# Patient Record
Sex: Male | Born: 1935 | Race: White | Hispanic: No | Smoking: Never smoker
Health system: Southern US, Community
[De-identification: ages and names within clinical notes are randomized; demographics above are authoritative.]

## PROBLEM LIST (undated history)

## (undated) DIAGNOSIS — C44211 Basal cell carcinoma of skin of unspecified ear and external auricular canal: Secondary | ICD-10-CM

## (undated) DIAGNOSIS — N400 Enlarged prostate without lower urinary tract symptoms: Secondary | ICD-10-CM

## (undated) DIAGNOSIS — L570 Actinic keratosis: Secondary | ICD-10-CM

## (undated) DIAGNOSIS — L309 Dermatitis, unspecified: Secondary | ICD-10-CM

## (undated) DIAGNOSIS — J45909 Unspecified asthma, uncomplicated: Secondary | ICD-10-CM

## (undated) DIAGNOSIS — IMO0002 Reserved for concepts with insufficient information to code with codable children: Secondary | ICD-10-CM

## (undated) DIAGNOSIS — I4891 Unspecified atrial fibrillation: Secondary | ICD-10-CM

## (undated) DIAGNOSIS — T7840XA Allergy, unspecified, initial encounter: Secondary | ICD-10-CM

## (undated) HISTORY — DX: Reserved for concepts with insufficient information to code with codable children: IMO0002

## (undated) HISTORY — PX: FINGER SURGERY: SHX640

## (undated) HISTORY — DX: Benign prostatic hyperplasia without lower urinary tract symptoms: N40.0

## (undated) HISTORY — DX: Actinic keratosis: L57.0

## (undated) HISTORY — PX: MOHS SURGERY: SUR867

## (undated) HISTORY — DX: Allergy, unspecified, initial encounter: T78.40XA

## (undated) HISTORY — DX: Unspecified asthma, uncomplicated: J45.909

## (undated) HISTORY — DX: Unspecified atrial fibrillation: I48.91

## (undated) HISTORY — DX: Basal cell carcinoma of skin of unspecified ear and external auricular canal: C44.211

## (undated) HISTORY — DX: Dermatitis, unspecified: L30.9

---

## 1968-08-12 HISTORY — PX: VASECTOMY: SHX75

## 1978-08-12 HISTORY — PX: OTHER SURGICAL HISTORY: SHX169

## 2005-08-12 DIAGNOSIS — C44211 Basal cell carcinoma of skin of unspecified ear and external auricular canal: Secondary | ICD-10-CM

## 2005-08-12 HISTORY — DX: Basal cell carcinoma of skin of unspecified ear and external auricular canal: C44.211

## 2011-08-26 DIAGNOSIS — L570 Actinic keratosis: Secondary | ICD-10-CM | POA: Diagnosis not present

## 2011-08-26 DIAGNOSIS — Z85828 Personal history of other malignant neoplasm of skin: Secondary | ICD-10-CM | POA: Diagnosis not present

## 2011-12-12 DIAGNOSIS — J309 Allergic rhinitis, unspecified: Secondary | ICD-10-CM | POA: Diagnosis not present

## 2011-12-12 DIAGNOSIS — N401 Enlarged prostate with lower urinary tract symptoms: Secondary | ICD-10-CM | POA: Diagnosis not present

## 2012-02-10 DIAGNOSIS — C4491 Basal cell carcinoma of skin, unspecified: Secondary | ICD-10-CM | POA: Diagnosis not present

## 2012-02-10 DIAGNOSIS — L2089 Other atopic dermatitis: Secondary | ICD-10-CM | POA: Diagnosis not present

## 2012-04-23 DIAGNOSIS — L821 Other seborrheic keratosis: Secondary | ICD-10-CM | POA: Diagnosis not present

## 2012-04-23 DIAGNOSIS — L820 Inflamed seborrheic keratosis: Secondary | ICD-10-CM | POA: Diagnosis not present

## 2012-04-23 DIAGNOSIS — L570 Actinic keratosis: Secondary | ICD-10-CM | POA: Diagnosis not present

## 2012-04-23 DIAGNOSIS — D485 Neoplasm of uncertain behavior of skin: Secondary | ICD-10-CM | POA: Diagnosis not present

## 2012-04-23 DIAGNOSIS — L738 Other specified follicular disorders: Secondary | ICD-10-CM | POA: Diagnosis not present

## 2012-05-14 DIAGNOSIS — E785 Hyperlipidemia, unspecified: Secondary | ICD-10-CM | POA: Insufficient documentation

## 2012-05-14 DIAGNOSIS — Z23 Encounter for immunization: Secondary | ICD-10-CM | POA: Diagnosis not present

## 2012-05-14 DIAGNOSIS — R5381 Other malaise: Secondary | ICD-10-CM | POA: Diagnosis not present

## 2012-05-14 DIAGNOSIS — Z1210 Encounter for screening for malignant neoplasm of intestinal tract, unspecified: Secondary | ICD-10-CM | POA: Diagnosis not present

## 2012-05-14 DIAGNOSIS — N529 Male erectile dysfunction, unspecified: Secondary | ICD-10-CM | POA: Diagnosis not present

## 2012-05-14 DIAGNOSIS — N400 Enlarged prostate without lower urinary tract symptoms: Secondary | ICD-10-CM | POA: Diagnosis not present

## 2012-05-19 DIAGNOSIS — R5383 Other fatigue: Secondary | ICD-10-CM | POA: Diagnosis not present

## 2012-05-19 DIAGNOSIS — E785 Hyperlipidemia, unspecified: Secondary | ICD-10-CM | POA: Diagnosis not present

## 2012-05-19 DIAGNOSIS — N529 Male erectile dysfunction, unspecified: Secondary | ICD-10-CM | POA: Diagnosis not present

## 2012-07-14 DIAGNOSIS — M25519 Pain in unspecified shoulder: Secondary | ICD-10-CM | POA: Diagnosis not present

## 2012-10-23 DIAGNOSIS — L738 Other specified follicular disorders: Secondary | ICD-10-CM | POA: Diagnosis not present

## 2012-10-23 DIAGNOSIS — L570 Actinic keratosis: Secondary | ICD-10-CM | POA: Diagnosis not present

## 2012-10-23 DIAGNOSIS — L819 Disorder of pigmentation, unspecified: Secondary | ICD-10-CM | POA: Diagnosis not present

## 2012-10-23 DIAGNOSIS — L820 Inflamed seborrheic keratosis: Secondary | ICD-10-CM | POA: Diagnosis not present

## 2013-02-09 ENCOUNTER — Encounter: Payer: Self-pay | Admitting: Adult Health

## 2013-02-09 ENCOUNTER — Ambulatory Visit (INDEPENDENT_AMBULATORY_CARE_PROVIDER_SITE_OTHER): Payer: Commercial Managed Care - PPO | Admitting: Adult Health

## 2013-02-09 VITALS — BP 100/58 | HR 70 | Temp 97.6°F | Resp 12 | Ht 67.0 in | Wt 148.5 lb

## 2013-02-09 DIAGNOSIS — N400 Enlarged prostate without lower urinary tract symptoms: Secondary | ICD-10-CM | POA: Diagnosis not present

## 2013-02-09 DIAGNOSIS — R252 Cramp and spasm: Secondary | ICD-10-CM | POA: Diagnosis not present

## 2013-02-09 DIAGNOSIS — IMO0002 Reserved for concepts with insufficient information to code with codable children: Secondary | ICD-10-CM | POA: Diagnosis not present

## 2013-02-09 DIAGNOSIS — L570 Actinic keratosis: Secondary | ICD-10-CM | POA: Diagnosis not present

## 2013-02-09 LAB — BASIC METABOLIC PANEL
CO2: 28 mEq/L (ref 19–32)
Calcium: 9.6 mg/dL (ref 8.4–10.5)
Chloride: 104 mEq/L (ref 96–112)
Glucose, Bld: 71 mg/dL (ref 70–99)
Potassium: 5.1 mEq/L (ref 3.5–5.1)
Sodium: 135 mEq/L (ref 135–145)

## 2013-02-09 NOTE — Patient Instructions (Addendum)
   Thank you for choosing [place] at [facility] for your health care needs.  Please have your labs drawn prior to leaving the office.  The results will be available through MyChart for your convenience. Please remember to activate this. The activation code is located at the end of this form.  Please schedule your Medicare Wellness Exam at the anniversary date.  Please feel free to contact our office with any questions or concerns.

## 2013-02-09 NOTE — Progress Notes (Signed)
Subjective:    Patient ID: Kelly Hoover, male    DOB: 1931-03-17, 79 y.o.   MRN: [id number]  HPI  Patient is a pleasant 79 year old male who presents to clinic to establish care. 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 Patient has no concerns during visit today. Overall he is feeling very well.  Past Medical History  Diagnosis Date  . Allergy   . Asthma     as a child  . Eczema   . Basal cell carcinoma of ear 2007    Mohs surgery - followed at [facility] q 6 months  . Actinic keratosis     Followed at [facility] q 6 month  . BPH (benign prostatic hypertrophy)   . Degenerative disk disease     C3-C4, C5-C6    Past Surgical History  Procedure Laterality Date  . Rectal fistula  1980    repair  . Vasectomy  1970  . Finger surgery      left hand middle and index finger  . Mohs surgery      top of right ear    Family History  Problem Relation Age of Onset  . Stroke Father     Died age 53  . Cancer Sister 61    lymphoma - remission  . Dementia Mother     died age 68 (dementia and osteopororis)  . Hypothyroidism Daughter   . Hypothyroidism Son   . Hypothyroidism Daughter   . Hypothyroidism Daughter     History   Social History  . Marital Status: Widowed    Spouse Name: N/A    Number of Children: 4  . Years of Education: 18   Occupational History  . Not on file.   Social History Main Topics  . Smoking status: Never Smoker   . Smokeless tobacco: Never Used  . Alcohol Use: 7.0 oz/week    14 drink(s) per week     Comment: 1-2 glasses of wine daily  . Drug Use: No  . Sexually Active: Not on file   Other Topics Concern  . Not on file   Social History Narrative  . No narrative on  file     Health Maintenance:  Tdap - 12/17/07 (Due 2019)  Flu shot - Yearly October  Pneumococcal vaccine - 1997  Zostavax - 07/18/07  Colonoscopy - Sigmoidoscopy 1997 - No colonoscopy  Labs - Yearly physical in October  Depression Screen - No hopelessness, sadness or feelings of anhedonia. He maintains an active schedule and socializes regularly.  Tobacco Use - No smoking  Dental Exams - Yearly exam and cleaning  Vision Exam - Yearly  Exercise - Regularly - Core exercises  Diet - High fiber, low saturated fats.     Review of Systems  Constitutional: Negative.   HENT: Positive for hearing loss, neck stiffness, sinus pressure and tinnitus. Negative for sore throat, trouble swallowing and neck pain.        Tinnitus bilateral ears. Not severe. Patient able to cope.  Eyes: Negative.   Respiratory: Negative.   Cardiovascular: Negative.   Gastrointestinal: Negative for nausea, vomiting, abdominal pain, diarrhea, constipation, blood in stool and rectal pain.       Takes metamucil for regularity  Endocrine: Negative.   Genitourinary: Positive for urgency and frequency. Negative for dysuria and hematuria.       Enlarged prostate. Urine slow to start. Gets up 1-2 times per night.  Musculoskeletal: Negative for back pain.  Skin:       Followed by Vaughan Sine every 6 months.  Allergic/Immunologic: Negative for environmental allergies and food allergies.       Mild seasonal allergies  Neurological: Negative for speech difficulty and numbness.       Occasional lightheaded - does not occur often  Hematological: Negative.   Psychiatric/Behavioral: Negative for behavioral problems, confusion and agitation. The patient is not nervous/anxious.     BP 100/58  Pulse 70  Temp(Src) 97.6 F (36.4 C) (Oral)  Resp 12  Ht 5\' 7"  (1.702 m)  Wt 148 lb 8 oz (67.359 kg)  BMI 23.25 kg/m2  SpO2 96%    Objective:   Physical Exam  Constitutional: He is oriented to person, place, and time. He  appears well-developed and well-nourished. No distress.  HENT:  Head: Normocephalic and atraumatic.  Right Ear: External ear normal.  Left Ear: External ear normal.  Nose: Nose normal.  Mouth/Throat: Oropharynx is clear and moist. No oropharyngeal exudate.  Eyes: Conjunctivae and EOM are normal. Pupils are equal, round, and reactive to light. Right eye exhibits no discharge. Left eye exhibits no discharge.  Neck: No tracheal deviation present.  Decreased ROM of neck  Cardiovascular: Normal rate, regular rhythm, normal heart sounds and intact distal pulses.  Exam reveals no gallop and no friction rub.   No murmur heard. Pulmonary/Chest: Effort normal and breath sounds normal. No respiratory distress. He has no wheezes. He has no rales.  Abdominal: Soft. Bowel sounds are normal. He exhibits no distension and no mass. There is no tenderness. There is no rebound and no guarding.  Musculoskeletal: Normal range of motion. He exhibits no edema and no tenderness.  Lymphadenopathy:    He has no cervical adenopathy.  Neurological: He is alert and oriented to person, place, and time. He has normal reflexes. No cranial nerve deficit. Coordination normal.  Skin: Skin is warm and dry.  Psychiatric: He has a normal mood and affect. His behavior is normal. Judgment and thought content normal.       Assessment & Plan:

## 2013-02-10 ENCOUNTER — Encounter: Payer: Self-pay | Admitting: *Deleted

## 2013-02-13 ENCOUNTER — Encounter: Payer: Self-pay | Admitting: Adult Health

## 2013-02-13 DIAGNOSIS — N400 Enlarged prostate without lower urinary tract symptoms: Secondary | ICD-10-CM | POA: Insufficient documentation

## 2013-02-13 DIAGNOSIS — IMO0002 Reserved for concepts with insufficient information to code with codable children: Secondary | ICD-10-CM | POA: Insufficient documentation

## 2013-02-13 DIAGNOSIS — L570 Actinic keratosis: Secondary | ICD-10-CM | POA: Insufficient documentation

## 2013-02-13 NOTE — Assessment & Plan Note (Signed)
Patient with decreased range of motion of the neck. Reports no pain. The patient has been stable and maintains activity level and reports good quality of life. Continue exercise.

## 2013-02-13 NOTE — Assessment & Plan Note (Signed)
Patient takes Flomax 0.4 mg every other day and has been doing this for several years. Reports getting up 1-2 times during the night but able to resume sleep easily. Continue same plan of care.

## 2013-02-13 NOTE — Assessment & Plan Note (Signed)
Followed by Vaughan Sine Q 6 months.

## 2013-02-24 ENCOUNTER — Other Ambulatory Visit: Payer: Self-pay | Admitting: Adult Health

## 2013-02-24 MED ORDER — MOMETASONE FUROATE 50 MCG/ACT NA SUSP: 2.0000 | Freq: Every day | NASAL | Status: DC

## 2013-02-24 NOTE — Telephone Encounter (Signed)
Needs Nasonex nasal spray or recommend an alternative.  States Kelly Hoover suggested a change in medication at his last visit which is fine with him.  [facility] Rx # [id number].  Phone# [phone] option #2.

## 2013-02-24 NOTE — Telephone Encounter (Signed)
Do you know what change he is referring to? Otherwise I'll send refill for Nasonex.

## 2013-02-24 NOTE — Telephone Encounter (Signed)
I cannot remember what I told him. Flonase? Anyway, send in prescription for Nasonex as long as still helping.

## 2013-03-01 ENCOUNTER — Telehealth: Payer: Self-pay | Admitting: Adult Health

## 2013-03-01 MED ORDER — MOMETASONE FUROATE 50 MCG/ACT NA SUSP: 2.0000 | Freq: Every day | NASAL | Status: DC

## 2013-03-01 NOTE — Telephone Encounter (Signed)
Yes, flonase

## 2013-03-01 NOTE — Telephone Encounter (Signed)
Pt is calling about the Nasonex was sent in wrong and it is charging him 55 dollars and month and wanted to know about hte alternative that Raquel stated at his appointment to see if that one would be cheaper ???

## 2013-03-01 NOTE — Telephone Encounter (Signed)
Pt states he received a one month supply of Nasonex, rather than a 3 month, which was more costly. He prefers to stay on Nasonex at this time, but requests a 3 month supply be sent in for future refills.

## 2013-03-01 NOTE — Telephone Encounter (Signed)
Flonase? That's what you mentioned in last message.

## 2013-03-17 ENCOUNTER — Other Ambulatory Visit: Payer: Self-pay

## 2013-03-30 DIAGNOSIS — H2510 Age-related nuclear cataract, unspecified eye: Secondary | ICD-10-CM | POA: Diagnosis not present

## 2013-04-13 ENCOUNTER — Telehealth: Payer: Self-pay | Admitting: Adult Health

## 2013-04-13 MED ORDER — TAMSULOSIN HCL 0.4 MG PO CAPS: 0.4000 mg | ORAL_CAPSULE | Freq: Every day | ORAL | Status: DC

## 2013-04-13 NOTE — Telephone Encounter (Signed)
Pt is needing refill on Tamsulosin HCL Caps 0.4MG  1 capsule daily. Pt uses [facility].

## 2013-04-28 DIAGNOSIS — D485 Neoplasm of uncertain behavior of skin: Secondary | ICD-10-CM | POA: Diagnosis not present

## 2013-04-28 DIAGNOSIS — L819 Disorder of pigmentation, unspecified: Secondary | ICD-10-CM | POA: Diagnosis not present

## 2013-04-28 DIAGNOSIS — C44319 Basal cell carcinoma of skin of other parts of face: Secondary | ICD-10-CM | POA: Diagnosis not present

## 2013-04-28 DIAGNOSIS — D692 Other nonthrombocytopenic purpura: Secondary | ICD-10-CM | POA: Diagnosis not present

## 2013-04-28 DIAGNOSIS — L570 Actinic keratosis: Secondary | ICD-10-CM | POA: Diagnosis not present

## 2013-06-09 DIAGNOSIS — D485 Neoplasm of uncertain behavior of skin: Secondary | ICD-10-CM | POA: Diagnosis not present

## 2013-06-09 DIAGNOSIS — Z85828 Personal history of other malignant neoplasm of skin: Secondary | ICD-10-CM | POA: Diagnosis not present

## 2013-06-09 DIAGNOSIS — L570 Actinic keratosis: Secondary | ICD-10-CM | POA: Diagnosis not present

## 2013-06-09 DIAGNOSIS — C44211 Basal cell carcinoma of skin of unspecified ear and external auricular canal: Secondary | ICD-10-CM | POA: Diagnosis not present

## 2013-06-10 DIAGNOSIS — Z23 Encounter for immunization: Secondary | ICD-10-CM | POA: Diagnosis not present

## 2013-06-17 ENCOUNTER — Other Ambulatory Visit: Payer: Self-pay

## 2013-06-24 DIAGNOSIS — L821 Other seborrheic keratosis: Secondary | ICD-10-CM | POA: Diagnosis not present

## 2013-06-24 DIAGNOSIS — L570 Actinic keratosis: Secondary | ICD-10-CM | POA: Diagnosis not present

## 2013-06-24 DIAGNOSIS — L820 Inflamed seborrheic keratosis: Secondary | ICD-10-CM | POA: Diagnosis not present

## 2013-06-24 DIAGNOSIS — Z85828 Personal history of other malignant neoplasm of skin: Secondary | ICD-10-CM | POA: Diagnosis not present

## 2013-06-24 DIAGNOSIS — C44319 Basal cell carcinoma of skin of other parts of face: Secondary | ICD-10-CM | POA: Diagnosis not present

## 2013-06-24 DIAGNOSIS — D485 Neoplasm of uncertain behavior of skin: Secondary | ICD-10-CM | POA: Diagnosis not present

## 2013-08-23 DIAGNOSIS — Z85828 Personal history of other malignant neoplasm of skin: Secondary | ICD-10-CM | POA: Diagnosis not present

## 2013-08-23 DIAGNOSIS — C44319 Basal cell carcinoma of skin of other parts of face: Secondary | ICD-10-CM | POA: Diagnosis not present

## 2013-10-21 DIAGNOSIS — Z85828 Personal history of other malignant neoplasm of skin: Secondary | ICD-10-CM | POA: Diagnosis not present

## 2013-10-21 DIAGNOSIS — L259 Unspecified contact dermatitis, unspecified cause: Secondary | ICD-10-CM | POA: Diagnosis not present

## 2013-10-21 DIAGNOSIS — L570 Actinic keratosis: Secondary | ICD-10-CM | POA: Diagnosis not present

## 2013-10-21 DIAGNOSIS — L820 Inflamed seborrheic keratosis: Secondary | ICD-10-CM | POA: Diagnosis not present

## 2013-10-21 DIAGNOSIS — R21 Rash and other nonspecific skin eruption: Secondary | ICD-10-CM | POA: Diagnosis not present

## 2013-11-16 ENCOUNTER — Encounter: Payer: Self-pay | Admitting: Adult Health

## 2013-11-16 ENCOUNTER — Ambulatory Visit (INDEPENDENT_AMBULATORY_CARE_PROVIDER_SITE_OTHER): Payer: Commercial Managed Care - PPO | Admitting: Adult Health

## 2013-11-16 VITALS — BP 108/60 | HR 83 | Temp 97.7°F | Resp 14 | Ht 67.5 in | Wt 153.0 lb

## 2013-11-16 DIAGNOSIS — R5381 Other malaise: Secondary | ICD-10-CM | POA: Diagnosis not present

## 2013-11-16 DIAGNOSIS — R42 Dizziness and giddiness: Secondary | ICD-10-CM | POA: Diagnosis not present

## 2013-11-16 DIAGNOSIS — G4730 Sleep apnea, unspecified: Secondary | ICD-10-CM | POA: Diagnosis not present

## 2013-11-16 DIAGNOSIS — R5383 Other fatigue: Secondary | ICD-10-CM | POA: Diagnosis not present

## 2013-11-16 DIAGNOSIS — E785 Hyperlipidemia, unspecified: Secondary | ICD-10-CM | POA: Diagnosis not present

## 2013-11-16 DIAGNOSIS — Z0000 Encounter for general adult medical examination without abnormal findings: Secondary | ICD-10-CM | POA: Diagnosis not present

## 2013-11-16 LAB — BASIC METABOLIC PANEL
BUN: 20 mg/dL (ref 6–23)
CHLORIDE: 103 meq/L (ref 96–112)
CO2: 27 mEq/L (ref 19–32)
CREATININE: 1.2 mg/dL (ref 0.4–1.5)
Calcium: 9.5 mg/dL (ref 8.4–10.5)
GFR: 64.95 mL/min (ref >60.00)
Glucose, Bld: 88 mg/dL (ref 70–99)
Potassium: 5 mEq/L (ref 3.5–5.1)
Sodium: 137 mEq/L (ref 135–145)

## 2013-11-16 LAB — CBC WITH DIFFERENTIAL/PLATELET
BASOS ABS: 0 10*3/uL (ref 0.0–0.1)
Basophils Relative: 0.2 % (ref 0.0–3.0)
EOS ABS: 0.3 10*3/uL (ref 0.0–0.7)
Eosinophils Relative: 2.8 % (ref 0.0–5.0)
HCT: 45 % (ref 39.0–52.0)
HEMOGLOBIN: 15.1 g/dL (ref 13.0–17.0)
LYMPHS PCT: 10.6 % — AB (ref 12.0–46.0)
Lymphs Abs: 1.3 10*3/uL (ref 0.7–4.0)
MCHC: 33.4 g/dL (ref 30.0–36.0)
MCV: 99.1 fl (ref 78.0–100.0)
Monocytes Absolute: 1.1 10*3/uL — ABNORMAL HIGH (ref 0.1–1.0)
Monocytes Relative: 9.3 % (ref 3.0–12.0)
NEUTROS ABS: 9.5 10*3/uL — AB (ref 1.4–7.7)
NEUTROS PCT: 77.1 % — AB (ref 43.0–77.0)
Platelets: 212 10*3/uL (ref 150.0–400.0)
RBC: 4.54 Mil/uL (ref 4.22–5.81)
RDW: 13.4 % (ref 11.5–14.6)
WBC: 12.3 10*3/uL — ABNORMAL HIGH (ref 4.5–10.5)

## 2013-11-16 LAB — LIPID PANEL
CHOLESTEROL: 206 mg/dL — AB (ref 0–200)
HDL: 71.3 mg/dL (ref >39.00)
LDL CALC: 124 mg/dL — AB (ref 0–99)
TRIGLYCERIDES: 54 mg/dL (ref 0.0–149.0)
Total CHOL/HDL Ratio: 3
VLDL: 10.8 mg/dL (ref 0.0–40.0)

## 2013-11-16 MED ORDER — PREDNISONE 10 MG PO TABS: ORAL_TABLET | ORAL | Status: DC

## 2013-11-16 NOTE — Progress Notes (Signed)
Pre visit review using our clinic review tool, if applicable. No additional management support is needed unless otherwise documented below in the visit note. 

## 2013-11-16 NOTE — Progress Notes (Signed)
Patient ID: Kelly Hoover, male   DOB: Sep 14, 1930, 80 y.o.   MRN: [id number]   Subjective:    Patient ID: Kelly Hoover, male    DOB: 05/24/1931, 80 y.o.   MRN: [id number]  HPI  The patient is here for annual Medicare wellness examination and management of other chronic and acute problems.   The risk factors are reflected in the social history.  The roster of all physicians providing medical care to patient is listed in the Snapshot section of the chart.  Activities of daily living:  The patient is 100% independent in all ADLs: dressing, toileting, bathing, feeding as well as independent mobility.  Instrumental Activities of daily living: The patient is 100% independent in all iADLs: cooking, driving, keeping track of finances, managing medications, shopping, using telephone and computer.  Home safety: The patient has smoke detectors in the home. Seatbelts are worn 100%.  There are no firearms at home. There is no violence in the home. No hx of IPV.  There is no risks for hepatitis, STDs or HIV. There is no history of blood transfusion. No travel history to infectious disease endemic areas of the world.  The patient has seen dentist in the last six month. Pt has seen eye doctor in the last year. No hearing impairment. They have deferred audiologic testing in the last year.  No excessive sun exposure. Discussed the need for sun protection: hats, long sleeves and use of sunscreen if there is significant sun exposure. He is followed by Dermatology every 6 months for hx of actinic keratosis.  Diet: the importance of a healthy diet is discussed. Pt follows a healthy diet. Not tolerating red meats so he is avoiding. Able to eat poultry, pork, fish.  The benefits of regular aerobic exercise were discussed.   Depression screen: there are no signs or vegative symptoms of depression- irritability, change in appetite, anhedonia, sadness/tearfullness.  Cognitive assessment: the patient manages all  their financial and personal affairs and is actively engaged. Able to relate day,date,year and events; recalled 2/3 objects at 3 minutes; performed clock-face test normally.  The following portions of the patient's history were reviewed and updated as appropriate: allergies, current medications, past family history, past medical history,  past surgical history, past social history  and problem list.  Visual acuity was not assessed per patient preference since has regular follow up with ophthalmologist. Hearing and body mass index were assessed and reviewed.   During the course of the visit the patient was educated and counseled about appropriate screening and preventive services including : fall prevention , diabetes screening, nutrition counseling, colorectal cancer screening, and recommended immunizations.    Pt reports that family has observed him have periods of apnea. He also has been told that he snores. Feeling tired during the day.   He reports an episode of getting lightheaded while playing golf. He felt like he was going to pass out but the symptom resolved quickly. He states that he has noticed his blood pressure lower on the morning after he takes his flomax. He only takes the flomax every other day. During that time, he also had traveled by car to [place]. Thought he might have been tired and slightly dehydrated from the trip. The incident occurred a day after returning from trip.    Past Medical History  Diagnosis Date  . Allergy   . Asthma     as a child  . Eczema   . Basal cell carcinoma of ear 2007  Mohs surgery - followed at [facility] q 6 months  . Actinic keratosis     Followed at [facility] q 6 month  . BPH (benign prostatic hypertrophy)   . Degenerative disk disease     C3-C4, C5-C6     Past Surgical History  Procedure Laterality Date  . Rectal fistula  1980    repair  . Vasectomy  1970  . Finger surgery      left hand middle and index finger  . Mohs surgery       top of right ear     Family History  Problem Relation Age of Onset  . Stroke Father     Died age 19  . Cancer Sister 47    lymphoma - remission  . Dementia Mother     died age 86 (dementia and osteopororis)  . Hypothyroidism Daughter   . Hypothyroidism Son   . Hypothyroidism Daughter   . Hypothyroidism Daughter      History   Social History  . Marital Status: Widowed    Spouse Name: N/A    Number of Children: 4  . Years of Education: 18   Occupational History  . Not on file.   Social History Main Topics  . Smoking status: Never Smoker   . Smokeless tobacco: Never Used  . Alcohol Use: 7.0 oz/week    14 drink(s) per week     Comment: 1-2 glasses of wine daily  . Drug Use: No  . Sexual Activity: Not on file   Other Topics Concern  . Not on file   Social History Narrative  . No narrative on file         Past Medical History  Diagnosis Date  . Allergy   . Asthma     as a child  . Eczema   . Basal cell carcinoma of ear 2007    Mohs surgery - followed at [facility] q 6 months  . Actinic keratosis     Followed at [facility] q 6 month  . BPH (benign prostatic hypertrophy)   . Degenerative disk disease     C3-C4, C5-C6     Past Surgical History  Procedure Laterality Date  . Rectal fistula  1980    repair  . Vasectomy  1970  . Finger surgery      left hand middle and index finger  . Mohs surgery      top of right ear     Family History  Problem Relation Age of Onset  . Stroke Father     Died age 68  . Cancer Sister 35    lymphoma - remission  . Dementia Mother     died age 60 (dementia and osteopororis)  . Hypothyroidism Daughter   . Hypothyroidism Son   . Hypothyroidism Daughter   . Hypothyroidism Daughter      History   Social History  . Marital Status: Widowed    Spouse Name: N/A    Number of Children: 4  . Years of Education: 18   Occupational History  . Not on file.   Social History Main Topics  . Smoking status:  Never Smoker   . Smokeless tobacco: Never Used  . Alcohol Use: 7.0 oz/week    14 drink(s) per week     Comment: 1-2 glasses of wine daily  . Drug Use: No  . Sexual Activity: Not on file   Other Topics Concern  . Not on file  Social History Narrative  . No narrative on file     Current Outpatient Prescriptions on File Prior to Visit  Medication Sig Dispense Refill  . aspirin 81 MG tablet Take 81 mg by mouth daily.      . Carboxymethylcellulose Sodium (THERATEARS OP) Apply to eye as needed.      . cholecalciferol (VITAMIN D) 1000 UNITS tablet Take 1,000 Units by mouth daily.      . mometasone (NASONEX) 50 MCG/ACT nasal spray Place 2 sprays into the nose daily.  51 g  3  . psyllium (METAMUCIL) 58.6 % packet Take 1 packet by mouth daily.      . sodium chloride (OCEAN) 0.65 % nasal spray Place 1 spray into the nose as needed for congestion.      . tamsulosin (FLOMAX) 0.4 MG CAPS capsule Take 1 capsule (0.4 mg total) by mouth daily.  90 capsule  1  . Triamcinolone Acetonide (TRIAMCINOLONE 0.1 % CREAM : EUCERIN) CREA Apply 1 application topically 2 (two) times daily as needed.       No current facility-administered medications on file prior to visit.     Review of Systems  Constitutional: Negative.   HENT: Negative.   Eyes: Negative.   Respiratory: Negative.   Cardiovascular: Negative.   Gastrointestinal: Negative.   Endocrine: Negative.   Genitourinary: Negative.   Musculoskeletal: Negative.   Skin: Negative.   Allergic/Immunologic: Negative.   Neurological: Negative.   Hematological: Negative.   Psychiatric/Behavioral: Negative.        Objective:  BP 108/60  Pulse 83  Temp(Src) 97.7 F (36.5 C) (Oral)  Resp 14  Ht 5' 7.5" (1.715 m)  Wt 153 lb (69.4 kg)  BMI 23.60 kg/m2  SpO2 97%   Physical Exam  Constitutional: He is oriented to person, place, and time. He appears well-developed and well-nourished. No distress.  HENT:  Head: Normocephalic and atraumatic.    Right Ear: External ear normal.  Left Ear: External ear normal.  Nose: Nose normal.  Mouth/Throat: Oropharynx is clear and moist.  Eyes: Conjunctivae and EOM are normal. Pupils are equal, round, and reactive to light.  Neck: Normal range of motion. Neck supple. No tracheal deviation present. No thyromegaly present.  Cardiovascular: Normal rate, regular rhythm, normal heart sounds and intact distal pulses.  Exam reveals no gallop and no friction rub.   No murmur heard. Pulmonary/Chest: Effort normal and breath sounds normal. No respiratory distress. He has no wheezes. He has no rales.  Abdominal: Soft. Bowel sounds are normal. He exhibits no distension and no mass. There is no tenderness. There is no rebound and no guarding.  Musculoskeletal: Normal range of motion. He exhibits no edema and no tenderness.  Lymphadenopathy:    He has no cervical adenopathy.  Neurological: He is alert and oriented to person, place, and time. He has normal reflexes. No cranial nerve deficit. Coordination normal.  Skin: Skin is warm and dry.  Psychiatric: He has a normal mood and affect. His behavior is normal. Judgment and thought content normal.       Assessment & Plan:   1. Routine general medical examination at a health care facility Normal physical exam. Annual comprehensive exam was done including prostate exam, . All screenings have been addressed.   2. HLD (hyperlipidemia) Screen for HLD. Check labs - Lipid panel  3. Sleep apnea Ordered sleep study. - Nocturnal polysomnography (NPSG); Future  4. Fatigue May be secondary to his reports of observed sleep apnea. Will check labs. -  CBC with Differential  5. Lightheaded Blood pressure is on the low end of normal. He is feeling fine today. Discussed that flomax can lower his blood pressure. He needs to drink fluids to stay hydrated. Change positions slowly. Will check labs.  - Basic metabolic panel

## 2013-11-16 NOTE — Patient Instructions (Signed)
  You had your Medicare Wellness Exam today.  Please have your labs drawn prior to leaving. I will contact you with the results once they are available.  The episode of lightheadedness could have been brought on by dehydration, changes in your electrolytes or by a drop in your blood pressure. I am checking you electrolytes and kidney function.  I have ordered a sleep study to evaluate for sleep apnea. The office will contact you with the information.  I have sent in a prescription for prednisone to [facility] for contact dermatitis.  I sent in a prescription for Nasonex to your mail order pharmacy.  Return for a 6 month follow up or sooner if necessary.

## 2013-11-21 ENCOUNTER — Encounter: Payer: Self-pay | Admitting: Adult Health

## 2013-11-29 ENCOUNTER — Encounter: Payer: Self-pay | Admitting: Adult Health

## 2013-11-29 ENCOUNTER — Other Ambulatory Visit: Payer: Self-pay | Admitting: Adult Health

## 2013-11-29 MED ORDER — FLUTICASONE PROPIONATE 50 MCG/ACT NA SUSP: 2.0000 | Freq: Every day | NASAL | Status: DC

## 2013-11-29 NOTE — Telephone Encounter (Signed)
Advice needed on generic medication for flonase. Please advise.

## 2013-12-16 ENCOUNTER — Encounter: Payer: Self-pay | Admitting: Adult Health

## 2013-12-27 ENCOUNTER — Encounter: Payer: Self-pay | Admitting: Adult Health

## 2013-12-27 ENCOUNTER — Ambulatory Visit (INDEPENDENT_AMBULATORY_CARE_PROVIDER_SITE_OTHER): Payer: Medicare Other | Admitting: Adult Health

## 2013-12-27 VITALS — BP 118/64 | HR 63 | Temp 97.8°F | Resp 14 | Wt 149.8 lb

## 2013-12-27 DIAGNOSIS — M549 Dorsalgia, unspecified: Secondary | ICD-10-CM | POA: Diagnosis not present

## 2013-12-27 DIAGNOSIS — R209 Unspecified disturbances of skin sensation: Secondary | ICD-10-CM | POA: Diagnosis not present

## 2013-12-27 DIAGNOSIS — Z23 Encounter for immunization: Secondary | ICD-10-CM

## 2013-12-27 DIAGNOSIS — R202 Paresthesia of skin: Secondary | ICD-10-CM

## 2013-12-27 NOTE — Addendum Note (Signed)
Addended by: Geni Bers on: 12/27/2013 04:49 PM   Modules accepted: Orders

## 2013-12-27 NOTE — Patient Instructions (Signed)
Back Pain, Adult Low back pain is very common. About 1 in 5 people have back pain.The cause of low back pain is rarely dangerous. The pain often gets better over time.About half of people with a sudden onset of back pain feel better in just 2 weeks. About 8 in 10 people feel better by 6 weeks.  CAUSES Some common causes of back pain include:  Strain of the muscles or ligaments supporting the spine.  Wear and tear (degeneration) of the spinal discs.  Arthritis.  Direct injury to the back. DIAGNOSIS Most of the time, the direct cause of low back pain is not known.However, back pain can be treated effectively even when the exact cause of the pain is unknown.Answering your caregiver's questions about your overall health and symptoms is one of the most accurate ways to make sure the cause of your pain is not dangerous. If your caregiver needs more information, he or she may order lab work or imaging tests (X-rays or MRIs).However, even if imaging tests show changes in your back, this usually does not require surgery. HOME CARE INSTRUCTIONS For many people, back pain returns.Since low back pain is rarely dangerous, it is often a condition that people can learn to manageon their own.   Remain active. It is stressful on the back to sit or stand in one place. Do not sit, drive, or stand in one place for more than 30 minutes at a time. Take short walks on level surfaces as soon as pain allows.Try to increase the length of time you walk each day.  Do not stay in bed.Resting more than 1 or 2 days can delay your recovery.  Do not avoid exercise or work.Your body is made to move.It is not dangerous to be active, even though your back may hurt.Your back will likely heal faster if you return to being active before your pain is gone.  Pay attention to your body when you bend and lift. Many people have less discomfortwhen lifting if they bend their knees, keep the load close to their bodies,and  avoid twisting. Often, the most comfortable positions are those that put less stress on your recovering back.  Find a comfortable position to sleep. Use a firm mattress and lie on your side with your knees slightly bent. If you lie on your back, put a pillow under your knees.  Only take over-the-counter or prescription medicines as directed by your caregiver. Over-the-counter medicines to reduce pain and inflammation are often the most helpful.Your caregiver may prescribe muscle relaxant drugs.These medicines help dull your pain so you can more quickly return to your normal activities and healthy exercise.  Put ice on the injured area.  Put ice in a plastic bag.  Place a towel between your skin and the bag.  Leave the ice on for 15-20 minutes, 03-04 times a day for the first 2 to 3 days. After that, ice and heat may be alternated to reduce pain and spasms.  Ask your caregiver about trying back exercises and gentle massage. This may be of some benefit.  Avoid feeling anxious or stressed.Stress increases muscle tension and can worsen back pain.It is important to recognize when you are anxious or stressed and learn ways to manage it.Exercise is a great option. SEEK MEDICAL CARE IF:  You have pain that is not relieved with rest or medicine.  You have pain that does not improve in 1 week.  You have new symptoms.  You are generally not feeling well. SEEK   IMMEDIATE MEDICAL CARE IF:   You have pain that radiates from your back into your legs.  You develop new bowel or bladder control problems.  You have unusual weakness or numbness in your arms or legs.  You develop nausea or vomiting.  You develop abdominal pain.  You feel faint. Document Released: 07/29/2005 Document Revised: 01/28/2012 Document Reviewed: 12/17/2010 [employer] Patient Information 2014 [employer].  

## 2013-12-27 NOTE — Progress Notes (Signed)
Pre visit review using our clinic review tool, if applicable. No additional management support is needed unless otherwise documented below in the visit note. 

## 2013-12-27 NOTE — Progress Notes (Signed)
   Subjective:    Patient ID: Kelly Hoover, male    DOB: December 05, 1930, 80 y.o.   MRN: [id number]  HPI Pt is an 80 y/o male who presents to clinic with c/o back pain. He is requesting a referral to physical therapy. Reports being on a long trip from [place] and felt back discomfort. When he arrived he did gardening including digging and carrying bags of dirt etc. He stretches on a daily basis. Exercises regularly. Golf does not bother him. Pt reports pain is worse in the morning. He has been applying heat with some relief.   Current Outpatient Prescriptions on File Prior to Visit  Medication Sig Dispense Refill  . aspirin 81 MG tablet Take 81 mg by mouth daily.      . Carboxymethylcellulose Sodium (THERATEARS OP) Apply to eye as needed.      . cholecalciferol (VITAMIN D) 1000 UNITS tablet Take 1,000 Units by mouth daily.      . fluticasone (FLONASE) 50 MCG/ACT nasal spray Place 2 sprays into both nostrils daily.  16 g  6  . predniSONE (DELTASONE) 10 MG tablet Take 60 mg (6 tablets) on the first day then decrease by 10 mg (1 tablet) daily until done.  21 tablet  0  . psyllium (METAMUCIL) 58.6 % packet Take 1 packet by mouth daily.      . sodium chloride (OCEAN) 0.65 % nasal spray Place 1 spray into the nose as needed for congestion.      . tamsulosin (FLOMAX) 0.4 MG CAPS capsule Take 1 capsule (0.4 mg total) by mouth daily.  90 capsule  1  . Triamcinolone Acetonide (TRIAMCINOLONE 0.1 % CREAM : EUCERIN) CREA Apply 1 application topically 2 (two) times daily as needed.       No current facility-administered medications on file prior to visit.     Review of Systems  Genitourinary: Negative.   Musculoskeletal: Positive for back pain. Negative for gait problem.  Neurological: Negative for weakness. Numbness: tingling in extremities. Cramps as well.  All other systems reviewed and are negative.      Objective:   Physical Exam  Constitutional: He is oriented to person, place, and time. He  appears well-developed and well-nourished. No distress.  HENT:  Head: Normocephalic and atraumatic.  Eyes: Conjunctivae and EOM are normal.  Neck: Neck supple.  Cardiovascular: Normal rate and regular rhythm.   Pulmonary/Chest: Effort normal. No respiratory distress.  Musculoskeletal: Normal range of motion. He exhibits no edema and no tenderness.  Pain is worse in the morning. Good ROM.   Neurological: He is alert and oriented to person, place, and time. Coordination normal.  Skin: Skin is warm and dry.  Psychiatric: He has a normal mood and affect. His behavior is normal. Judgment and thought content normal.   BP 118/64  Pulse 63  Temp(Src) 97.8 F (36.6 C) (Oral)  Resp 14  Wt 149 lb 12 oz (67.926 kg)  SpO2 97%      Assessment & Plan:   1. Back pain Stretching exercises. Refer to Physical therapy for other recommendations. Pt is very active. Continue with heat. Avoid aggravating activities until back heals.  2. Tingling in extremities Check labs. Pt is inquiring about taking potassium tablets. Discussed getting potassium from diet. His last labs were very good. Supplements could make his potassium elevated causing cardiac arrhythmias.  - Vitamin B12 - Folate

## 2013-12-28 LAB — FOLATE: FOLATE: 14.1 ng/mL (ref >5.9)

## 2013-12-28 LAB — VITAMIN B12: VITAMIN B 12: 323 pg/mL (ref 211–911)

## 2013-12-29 ENCOUNTER — Encounter: Payer: Self-pay | Admitting: Adult Health

## 2013-12-30 DIAGNOSIS — M549 Dorsalgia, unspecified: Secondary | ICD-10-CM | POA: Diagnosis not present

## 2013-12-31 DIAGNOSIS — M549 Dorsalgia, unspecified: Secondary | ICD-10-CM | POA: Diagnosis not present

## 2014-01-04 DIAGNOSIS — M549 Dorsalgia, unspecified: Secondary | ICD-10-CM | POA: Diagnosis not present

## 2014-01-06 DIAGNOSIS — M549 Dorsalgia, unspecified: Secondary | ICD-10-CM | POA: Diagnosis not present

## 2014-01-07 DIAGNOSIS — M549 Dorsalgia, unspecified: Secondary | ICD-10-CM | POA: Diagnosis not present

## 2014-01-10 DIAGNOSIS — M549 Dorsalgia, unspecified: Secondary | ICD-10-CM | POA: Diagnosis not present

## 2014-01-22 ENCOUNTER — Other Ambulatory Visit: Payer: Self-pay | Admitting: Adult Health

## 2014-02-22 ENCOUNTER — Encounter: Payer: Self-pay | Admitting: Adult Health

## 2014-02-22 MED ORDER — FLUTICASONE PROPIONATE 50 MCG/ACT NA SUSP: 2.0000 | Freq: Every day | NASAL | Status: DC

## 2014-04-19 ENCOUNTER — Encounter: Payer: Self-pay | Admitting: Adult Health

## 2014-06-02 DIAGNOSIS — Z23 Encounter for immunization: Secondary | ICD-10-CM | POA: Diagnosis not present

## 2014-06-21 DIAGNOSIS — Z1283 Encounter for screening for malignant neoplasm of skin: Secondary | ICD-10-CM | POA: Diagnosis not present

## 2014-06-21 DIAGNOSIS — L578 Other skin changes due to chronic exposure to nonionizing radiation: Secondary | ICD-10-CM | POA: Diagnosis not present

## 2014-06-21 DIAGNOSIS — D229 Melanocytic nevi, unspecified: Secondary | ICD-10-CM | POA: Diagnosis not present

## 2014-06-21 DIAGNOSIS — L570 Actinic keratosis: Secondary | ICD-10-CM | POA: Diagnosis not present

## 2014-06-21 DIAGNOSIS — Z85828 Personal history of other malignant neoplasm of skin: Secondary | ICD-10-CM | POA: Diagnosis not present

## 2014-06-21 DIAGNOSIS — D692 Other nonthrombocytopenic purpura: Secondary | ICD-10-CM | POA: Diagnosis not present

## 2014-06-21 DIAGNOSIS — L853 Xerosis cutis: Secondary | ICD-10-CM | POA: Diagnosis not present

## 2014-06-21 DIAGNOSIS — L814 Other melanin hyperpigmentation: Secondary | ICD-10-CM | POA: Diagnosis not present

## 2014-06-24 DIAGNOSIS — H0013 Chalazion right eye, unspecified eyelid: Secondary | ICD-10-CM | POA: Diagnosis not present

## 2014-07-19 ENCOUNTER — Telehealth: Payer: Self-pay | Admitting: Nurse Practitioner

## 2014-07-19 MED ORDER — TAMSULOSIN HCL 0.4 MG PO CAPS: 0.4000 mg | ORAL_CAPSULE | Freq: Every day | ORAL | Status: DC

## 2014-07-19 NOTE — Telephone Encounter (Signed)
Patient would like his Tamsulosin hcl caps 0.75ml prescription renewed.  He said [facility] sent a renewal in 10 days ago and is waiting on [facility] to respond. Patient was very upset.

## 2014-07-19 NOTE — Telephone Encounter (Signed)
I did not see any sort of request (unsure if they come to me or CMA's can see it). I sent in refill. Please inform Kelly Hoover. Thanks, CD.

## 2014-07-19 NOTE — Telephone Encounter (Signed)
Rx sent to pharmacy by [facility]  

## 2014-07-20 ENCOUNTER — Other Ambulatory Visit: Payer: Self-pay | Admitting: *Deleted

## 2014-07-20 MED ORDER — TAMSULOSIN HCL 0.4 MG PO CAPS: 0.4000 mg | ORAL_CAPSULE | Freq: Every day | ORAL | Status: DC

## 2014-09-16 DIAGNOSIS — H02109 Unspecified ectropion of unspecified eye, unspecified eyelid: Secondary | ICD-10-CM | POA: Diagnosis not present

## 2014-10-14 DIAGNOSIS — H02109 Unspecified ectropion of unspecified eye, unspecified eyelid: Secondary | ICD-10-CM | POA: Diagnosis not present

## 2014-11-30 DIAGNOSIS — H16141 Punctate keratitis, right eye: Secondary | ICD-10-CM | POA: Diagnosis not present

## 2014-12-02 ENCOUNTER — Telehealth: Payer: Self-pay | Admitting: Nurse Practitioner

## 2014-12-02 ENCOUNTER — Ambulatory Visit (INDEPENDENT_AMBULATORY_CARE_PROVIDER_SITE_OTHER): Payer: Medicare Other | Admitting: Nurse Practitioner

## 2014-12-02 ENCOUNTER — Encounter: Payer: Self-pay | Admitting: Nurse Practitioner

## 2014-12-02 VITALS — BP 131/73 | HR 64 | Temp 97.8°F | Resp 14 | Ht 66.0 in | Wt 151.4 lb

## 2014-12-02 DIAGNOSIS — Z0000 Encounter for general adult medical examination without abnormal findings: Secondary | ICD-10-CM

## 2014-12-02 DIAGNOSIS — N400 Enlarged prostate without lower urinary tract symptoms: Secondary | ICD-10-CM | POA: Diagnosis not present

## 2014-12-02 NOTE — Progress Notes (Signed)
   Subjective:    Patient ID: Kelly Hoover, male    DOB: Jan 08, 1931, 81 y.o.   MRN: [id number]  HPI  Health Screenings  Colonoscopy- N/A PSA- Enlarged prostate, no PSA test on file  Glaucoma- UTD eye exam  Hearing- Trouble hearing, has had evaluated Hemoglobin A1C- Needs updated Cholesterol- Needs updated  Social  Alcohol intake- 0-2 drinks a night  Smoking history- Never Smokers in home- None Illicit drug use- Denies Exercise- Walks 3-4 x a week about 3 miles, core classes for an hour,  weight training  Diet- Eats at home mostly  Sexually Active- "Somewhat"  Multiple Partners- 1 partner   Safety  Patient feesl safe at home- Yes  Patient does have smoke detectors at home- Yes Patient does wear sunscreen or protective clothing when in direct sunlight- Yes  Patient does wear seat belt when driving or riding with others- Yes   Activities of Daily Living Patient can do their own household chores. Denies needing assistance with: driving, feeding themselves, getting from bed to chair, getting to the toilet, bathing/showering, dressing, managing money, climbing flight of stairs, or preparing meals.   Depression Screen Patient denies losing interest in daily life, feeling hopeless, or crying easily over simple problems.  Fall Screen Patient denies being afraid of falling or falling in the last year.  Memory Screen Patient denies problems with memory, misplacing items, and is able to balance checkbook/bank accounts.   Yes to misplacing items happens 1-2 x a week  Patient is alert, normal appearance, oriented to person/place/and time. Correctly identified the president of the [place], recall of 3/3 objects, and performing simple calculations.  Patient displays appropriate judgement and can read correct time from watch face.   Immunizations The following Immunizations are up to date: Influenza, shingles, pneumonia, and tetanus.   Other providers Dermatology twice a year- Brendolyn Patty  PCP- Lorane Gell, NP  Review of Systems No ROS AWV     Objective:   Physical Exam  Constitutional:  BP 131/73 mmHg  Pulse 64  Temp(Src) 97.8 F (36.6 C) (Oral)  Resp 14  Ht 5\' 6"  (1.676 m)  Wt 151 lb 6.4 oz (68.675 kg)  BMI 24.45 kg/m2  SpO2 98%    No physical exam- AWV     Assessment & Plan:  This is a routine wellness examination for this patient.  I reviewed all health maintenance protocols including colonoscopy, bone density.  Needed referrals placed. Age and diagnosis appropriate screening labs were ordered.  His immunization history was reviewed and appropriate vaccinations were ordered.  His current medications and allergies were reviewed and needed refills of his chronic medications were ordered if needed.   Updated labs as recommended by Medicare: PSA, Lipid, and HgBA1c were obtained today.   The plan for 5-10 year screening schedule and yearly plan for health was placed on AVS, handed, and reviewed with patient.   MEDICARE ATTESTATION I have personally reviewed:  The patient's medical and social history. The use of alcohol, tobacco and illicit drugs. The current medications and supplements. The patient's function ability including ADLs, fall risks, home safety risks, cognitive, and hearing and visual impairment.   Diet and physical activities. Evaluation for depression and mood disorders.    The patient's weight, height, BMI and visual acuity have been recorded in the chart.  I have made referrals, counseled and provided education to the patient based on review of the above.

## 2014-12-02 NOTE — Telephone Encounter (Signed)
Pt request the visit be coded as wellness visit.msn

## 2014-12-02 NOTE — Progress Notes (Signed)
Pre visit review using our clinic review tool, if applicable. No additional management support is needed unless otherwise documented below in the visit note. 

## 2014-12-02 NOTE — Patient Instructions (Signed)

## 2014-12-11 ENCOUNTER — Encounter: Payer: Self-pay | Admitting: Nurse Practitioner

## 2014-12-11 NOTE — Assessment & Plan Note (Signed)
Medicare PSA screen

## 2014-12-18 ENCOUNTER — Encounter: Payer: Self-pay | Admitting: Nurse Practitioner

## 2014-12-20 DIAGNOSIS — L820 Inflamed seborrheic keratosis: Secondary | ICD-10-CM | POA: Diagnosis not present

## 2014-12-20 DIAGNOSIS — L814 Other melanin hyperpigmentation: Secondary | ICD-10-CM | POA: Diagnosis not present

## 2014-12-20 DIAGNOSIS — L219 Seborrheic dermatitis, unspecified: Secondary | ICD-10-CM | POA: Diagnosis not present

## 2014-12-20 DIAGNOSIS — L821 Other seborrheic keratosis: Secondary | ICD-10-CM | POA: Diagnosis not present

## 2014-12-20 DIAGNOSIS — I788 Other diseases of capillaries: Secondary | ICD-10-CM | POA: Diagnosis not present

## 2014-12-20 DIAGNOSIS — L300 Nummular dermatitis: Secondary | ICD-10-CM | POA: Diagnosis not present

## 2014-12-20 DIAGNOSIS — L720 Epidermal cyst: Secondary | ICD-10-CM | POA: Diagnosis not present

## 2014-12-20 DIAGNOSIS — Z85828 Personal history of other malignant neoplasm of skin: Secondary | ICD-10-CM | POA: Diagnosis not present

## 2014-12-20 DIAGNOSIS — L570 Actinic keratosis: Secondary | ICD-10-CM | POA: Diagnosis not present

## 2014-12-20 DIAGNOSIS — L578 Other skin changes due to chronic exposure to nonionizing radiation: Secondary | ICD-10-CM | POA: Diagnosis not present

## 2014-12-22 ENCOUNTER — Other Ambulatory Visit (INDEPENDENT_AMBULATORY_CARE_PROVIDER_SITE_OTHER): Payer: Medicare Other

## 2014-12-22 DIAGNOSIS — Z0000 Encounter for general adult medical examination without abnormal findings: Secondary | ICD-10-CM

## 2014-12-22 DIAGNOSIS — Z125 Encounter for screening for malignant neoplasm of prostate: Secondary | ICD-10-CM

## 2014-12-22 DIAGNOSIS — N400 Enlarged prostate without lower urinary tract symptoms: Secondary | ICD-10-CM

## 2014-12-22 LAB — LIPID PANEL
CHOL/HDL RATIO: 3
Cholesterol: 180 mg/dL (ref 0–200)
HDL: 64.3 mg/dL (ref >39.00)
LDL CALC: 105 mg/dL — AB (ref 0–99)
NONHDL: 115.7
TRIGLYCERIDES: 54 mg/dL (ref 0.0–149.0)
VLDL: 10.8 mg/dL (ref 0.0–40.0)

## 2014-12-22 LAB — HEMOGLOBIN A1C: HEMOGLOBIN A1C: 5.5 % (ref 4.6–6.5)

## 2014-12-22 LAB — PSA, MEDICARE: PSA: 0.54 ng/mL (ref 0.10–4.00)

## 2014-12-23 ENCOUNTER — Other Ambulatory Visit: Payer: Self-pay | Admitting: Internal Medicine

## 2014-12-23 ENCOUNTER — Other Ambulatory Visit: Payer: Self-pay | Admitting: Nurse Practitioner

## 2014-12-23 MED ORDER — LEVOFLOXACIN 500 MG PO TABS: 500.0000 mg | ORAL_TABLET | Freq: Every day | ORAL | Status: DC

## 2015-01-28 ENCOUNTER — Other Ambulatory Visit: Payer: Self-pay | Admitting: Nurse Practitioner

## 2015-03-02 ENCOUNTER — Other Ambulatory Visit: Payer: Self-pay | Admitting: *Deleted

## 2015-03-02 MED ORDER — FLUTICASONE PROPIONATE 50 MCG/ACT NA SUSP: 2.0000 | Freq: Every day | NASAL | Status: DC

## 2015-06-08 DIAGNOSIS — Z23 Encounter for immunization: Secondary | ICD-10-CM | POA: Diagnosis not present

## 2015-06-20 DIAGNOSIS — Z85828 Personal history of other malignant neoplasm of skin: Secondary | ICD-10-CM | POA: Diagnosis not present

## 2015-06-20 DIAGNOSIS — L820 Inflamed seborrheic keratosis: Secondary | ICD-10-CM | POA: Diagnosis not present

## 2015-06-20 DIAGNOSIS — L570 Actinic keratosis: Secondary | ICD-10-CM | POA: Diagnosis not present

## 2015-06-20 DIAGNOSIS — L219 Seborrheic dermatitis, unspecified: Secondary | ICD-10-CM | POA: Diagnosis not present

## 2015-06-20 DIAGNOSIS — L821 Other seborrheic keratosis: Secondary | ICD-10-CM | POA: Diagnosis not present

## 2015-06-20 DIAGNOSIS — L578 Other skin changes due to chronic exposure to nonionizing radiation: Secondary | ICD-10-CM | POA: Diagnosis not present

## 2015-06-20 DIAGNOSIS — Z1283 Encounter for screening for malignant neoplasm of skin: Secondary | ICD-10-CM | POA: Diagnosis not present

## 2015-06-20 DIAGNOSIS — D229 Melanocytic nevi, unspecified: Secondary | ICD-10-CM | POA: Diagnosis not present

## 2015-06-20 DIAGNOSIS — D692 Other nonthrombocytopenic purpura: Secondary | ICD-10-CM | POA: Diagnosis not present

## 2015-07-30 ENCOUNTER — Other Ambulatory Visit: Payer: Self-pay | Admitting: Nurse Practitioner

## 2015-09-15 DIAGNOSIS — L570 Actinic keratosis: Secondary | ICD-10-CM | POA: Diagnosis not present

## 2015-09-15 DIAGNOSIS — L578 Other skin changes due to chronic exposure to nonionizing radiation: Secondary | ICD-10-CM | POA: Diagnosis not present

## 2015-09-15 DIAGNOSIS — L853 Xerosis cutis: Secondary | ICD-10-CM | POA: Diagnosis not present

## 2016-01-16 DIAGNOSIS — D692 Other nonthrombocytopenic purpura: Secondary | ICD-10-CM | POA: Diagnosis not present

## 2016-01-16 DIAGNOSIS — Z85828 Personal history of other malignant neoplasm of skin: Secondary | ICD-10-CM | POA: Diagnosis not present

## 2016-01-16 DIAGNOSIS — L578 Other skin changes due to chronic exposure to nonionizing radiation: Secondary | ICD-10-CM | POA: Diagnosis not present

## 2016-01-16 DIAGNOSIS — L570 Actinic keratosis: Secondary | ICD-10-CM | POA: Diagnosis not present

## 2016-01-16 DIAGNOSIS — L219 Seborrheic dermatitis, unspecified: Secondary | ICD-10-CM | POA: Diagnosis not present

## 2016-01-16 DIAGNOSIS — L853 Xerosis cutis: Secondary | ICD-10-CM | POA: Diagnosis not present

## 2016-01-25 ENCOUNTER — Encounter: Payer: Self-pay | Admitting: Family Medicine

## 2016-01-25 ENCOUNTER — Ambulatory Visit (INDEPENDENT_AMBULATORY_CARE_PROVIDER_SITE_OTHER): Payer: Medicare Other | Admitting: Family Medicine

## 2016-01-25 VITALS — BP 116/64 | HR 64 | Temp 98.4°F | Ht 66.0 in | Wt 149.6 lb

## 2016-01-25 DIAGNOSIS — R42 Dizziness and giddiness: Secondary | ICD-10-CM | POA: Diagnosis not present

## 2016-01-25 DIAGNOSIS — Z23 Encounter for immunization: Secondary | ICD-10-CM

## 2016-01-25 DIAGNOSIS — Z125 Encounter for screening for malignant neoplasm of prostate: Secondary | ICD-10-CM | POA: Diagnosis not present

## 2016-01-25 DIAGNOSIS — M546 Pain in thoracic spine: Secondary | ICD-10-CM | POA: Insufficient documentation

## 2016-01-25 DIAGNOSIS — C4491 Basal cell carcinoma of skin, unspecified: Secondary | ICD-10-CM | POA: Diagnosis not present

## 2016-01-25 DIAGNOSIS — Z0000 Encounter for general adult medical examination without abnormal findings: Secondary | ICD-10-CM | POA: Diagnosis not present

## 2016-01-25 LAB — BASIC METABOLIC PANEL
BUN: 16 mg/dL (ref 6–23)
CALCIUM: 9.4 mg/dL (ref 8.4–10.5)
CO2: 29 mEq/L (ref 19–32)
Chloride: 105 mEq/L (ref 96–112)
Creatinine, Ser: 1.12 mg/dL (ref 0.40–1.50)
GFR: 66.59 mL/min (ref >60.00)
Glucose, Bld: 103 mg/dL — ABNORMAL HIGH (ref 70–99)
POTASSIUM: 4.4 meq/L (ref 3.5–5.1)
SODIUM: 138 meq/L (ref 135–145)

## 2016-01-25 LAB — PSA, MEDICARE: PSA: 0.61 ng/ml (ref 0.10–4.00)

## 2016-01-25 NOTE — Assessment & Plan Note (Signed)
Had several days of this previously. Resolved on its own. No abnormalities on exam today. Neurologically intact. The lungs sounds clear. Vital signs are stable. Suspect possibly musculoskeletal in nature. Given resolution he'll continue to monitor. If recurs he will let us know. Given return precautions.

## 2016-01-25 NOTE — Patient Instructions (Addendum)
Nice to meet you. Please continue to stay physically active. We will check lab work today and give you call with the results. Please remain well hydrated and if to have repeated lightheadedness please let us know. If you develop any issues with further falls please let us know as well. If you develop chest pain, shortness of breath, recurrence of the discomfort in her back, or any new or changing symptoms please seek medical attention.  Health Maintenance, Male A healthy lifestyle and preventative care can promote health and wellness.  Maintain regular health, dental, and eye exams.  Eat a healthy diet. Foods like vegetables, fruits, whole grains, low-fat dairy products, and lean protein foods contain the nutrients you need and are low in calories. Decrease your intake of foods high in solid fats, added sugars, and salt. Get information about a proper diet from your health care provider, if necessary.  Regular physical exercise is one of the most important things you can do for your health. Most adults should get at least 150 minutes of moderate-intensity exercise (any activity that increases your heart rate and causes you to sweat) each week. In addition, most adults need muscle-strengthening exercises on 2 or more days a week.   Maintain a healthy weight. The body mass index (BMI) is a screening tool to identify possible weight problems. It provides an estimate of body fat based on height and weight. Your health care provider can find your BMI and can help you achieve or maintain a healthy weight. For males 20 years and older:  A BMI below 18.5 is considered underweight.  A BMI of 18.5 to 24.9 is normal.  A BMI of 25 to 29.9 is considered overweight.  A BMI of 30 and above is considered obese.  Maintain normal blood lipids and cholesterol by exercising and minimizing your intake of saturated fat. Eat a balanced diet with plenty of fruits and vegetables. Blood tests for lipids and  cholesterol should begin at age 41 and be repeated every 5 years. If your lipid or cholesterol levels are high, you are over age 11, or you are at high risk for heart disease, you may need your cholesterol levels checked more frequently.Ongoing high lipid and cholesterol levels should be treated with medicines if diet and exercise are not working.  If you smoke, find out from your health care provider how to quit. If you do not use tobacco, do not start.  Lung cancer screening is recommended for adults aged 69-80 years who are at high risk for developing lung cancer because of a history of smoking. A yearly low-dose CT scan of the lungs is recommended for people who have at least a 30-pack-year history of smoking and are current smokers or have quit within the past 15 years. A pack year of smoking is smoking an average of 1 pack of cigarettes a day for 1 year (for example, a 30-pack-year history of smoking could mean smoking 1 pack a day for 30 years or 2 packs a day for 15 years). Yearly screening should continue until the smoker has stopped smoking for at least 15 years. Yearly screening should be stopped for people who develop a health problem that would prevent them from having lung cancer treatment.  If you choose to drink alcohol, do not have more than 2 drinks per day. One drink is considered to be 12 oz (360 mL) of beer, 5 oz (150 mL) of wine, or 1.5 oz (45 mL) of liquor.  Avoid the  use of street drugs. Do not share needles with anyone. Ask for help if you need support or instructions about stopping the use of drugs.  High blood pressure causes heart disease and increases the risk of stroke. High blood pressure is more likely to develop in:  People who have blood pressure in the end of the normal range (100-139/85-89 mm Hg).  People who are overweight or obese.  People who are African American.  If you are 40-85 years of age, have your blood pressure checked every 3-5 years. If you are 63  years of age or older, have your blood pressure checked every year. You should have your blood pressure measured twice--once when you are at a hospital or clinic, and once when you are not at a hospital or clinic. Record the average of the two measurements. To check your blood pressure when you are not at a hospital or clinic, you can use:  An automated blood pressure machine at a pharmacy.  A home blood pressure monitor.  If you are 38-23 years old, ask your health care provider if you should take aspirin to prevent heart disease.  Diabetes screening involves taking a blood sample to check your fasting blood sugar level. This should be done once every 3 years after age 79 if you are at a normal weight and without risk factors for diabetes. Testing should be considered at a younger age or be carried out more frequently if you are overweight and have at least 1 risk factor for diabetes.  Colorectal cancer can be detected and often prevented. Most routine colorectal cancer screening begins at the age of 69 and continues through age 19. However, your health care provider may recommend screening at an earlier age if you have risk factors for colon cancer. On a yearly basis, your health care provider may provide home test kits to check for hidden blood in the stool. A small camera at the end of a tube may be used to directly examine the colon (sigmoidoscopy or colonoscopy) to detect the earliest forms of colorectal cancer. Talk to your health care provider about this at age 8 when routine screening begins. A direct exam of the colon should be repeated every 5-10 years through age 30, unless early forms of precancerous polyps or small growths are found.  People who are at an increased risk for hepatitis B should be screened for this virus. You are considered at high risk for hepatitis B if:  You were born in a country where hepatitis B occurs often. Talk with your health care provider about which countries  are considered high risk.  Your parents were born in a high-risk country and you have not received a shot to protect against hepatitis B (hepatitis B vaccine).  You have HIV or AIDS.  You use needles to inject street drugs.  You live with, or have sex with, someone who has hepatitis B.  You are a man who has sex with other men (MSM).  You get hemodialysis treatment.  You take certain medicines for conditions like cancer, organ transplantation, and autoimmune conditions.  Hepatitis C blood testing is recommended for all people born from [birth year] through 1963 and any individual with known risk factors for hepatitis C.  Healthy men should no longer receive prostate-specific antigen (PSA) blood tests as part of routine cancer screening. Talk to your health care provider about prostate cancer screening.  Testicular cancer screening is not recommended for adolescents or adult males who have no  symptoms. Screening includes self-exam, a health care provider exam, and other screening tests. Consult with your health care provider about any symptoms you have or any concerns you have about testicular cancer.  Practice safe sex. Use condoms and avoid high-risk sexual practices to reduce the spread of sexually transmitted infections (STIs).  You should be screened for STIs, including gonorrhea and chlamydia if:  You are sexually active and are younger than 24 years.  You are older than 24 years, and your health care provider tells you that you are at risk for this type of infection.  Your sexual activity has changed since you were last screened, and you are at an increased risk for chlamydia or gonorrhea. Ask your health care provider if you are at risk.  If you are at risk of being infected with HIV, it is recommended that you take a prescription medicine daily to prevent HIV infection. This is called pre-exposure prophylaxis (PrEP). You are considered at risk if:  You are a man who has sex with  other men (MSM).  You are a heterosexual man who is sexually active with multiple partners.  You take drugs by injection.  You are sexually active with a partner who has HIV.  Talk with your health care provider about whether you are at high risk of being infected with HIV. If you choose to begin PrEP, you should first be tested for HIV. You should then be tested every 3 months for as long as you are taking PrEP.  Use sunscreen. Apply sunscreen liberally and repeatedly throughout the day. You should seek shade when your shadow is shorter than you. Protect yourself by wearing long sleeves, pants, a wide-brimmed hat, and sunglasses year round whenever you are outdoors.  Tell your health care provider of new moles or changes in moles, especially if there is a change in shape or color. Also, tell your health care provider if a mole is larger than the size of a pencil eraser.  A one-time screening for abdominal aortic aneurysm (AAA) and surgical repair of large AAAs by ultrasound is recommended for men aged 27-75 years who are current or former smokers.  Stay current with your vaccines (immunizations).   This information is not intended to replace advice given to you by your health care provider. Make sure you discuss any questions you have with your health care provider.   Document Released: 01/25/2008 Document Revised: 08/19/2014 Document Reviewed: 12/24/2010 Elsevier Interactive Patient Education [employer].

## 2016-01-25 NOTE — Assessment & Plan Note (Signed)
Followed by dermatology

## 2016-01-25 NOTE — Progress Notes (Signed)
Patient ID: Kelly Hoover, male   DOB: 1931/04/18, 82 y.o.   MRN: [id number]  Kelly Rumps, MD Phone: [phone]  Kelly Hoover is a 82 y.o. male who presents today for annual wellness visit.  Health Screenings  Colonoscopy- N/A given age >63 - last was 89 or 15 years ago PSA- Enlarged prostate, normal PSA last year  Glaucoma- UTD eye exam - 1 year ago - has an appointment in the next 3 weeks Hearing- Trouble hearing, getting worse, wants to avoid hearing aids - does not want further evaluation of this.  Hemoglobin A1C- last was 5.5 Cholesterol- last year with total cholesterol 180, LDL 105  Social  Alcohol intake- 0-2 drinks a night  Smoking history- Never Smokers in home- None currently - father smoked Illicit drug use- Denies Exercise- Walks 3-4 x a week about 3 miles, core classes 2x/week, weight training, golf 2x/week Diet- Eats at home mostly, 3 meals a day, eats out 1x/week, lots vegetables, more fruit Sexually Active- occasionally  Multiple Partners- 1 partner   Safety  Patient feels safe at home- Yes  Patient does have smoke detectors at home- Yes Patient does wear sunscreen or protective clothing when in direct sunlight- Yes  Patient does wear seat belt when driving or riding with others- Yes   Activities of Daily Living Patient can do their own household chores. Cleaning lady one time per month, Denies needing assistance with: driving, feeding themselves, getting from bed to chair, getting to the toilet, bathing/showering, dressing, managing money, climbing flight of stairs, or preparing meals.   Depression Screen Patient denies losing interest in daily life, feeling hopeless, or crying easily over simple problems.  Fall Screen Patient fell 6-9 months ago. Was in an exercise class on mats and slipped on towel. Not afraid of falling. Has not fallen since. In the past has had light headedness if does not drink a lot of water. Lightheadedness would only  occur on rising. Has fallen related to light headedness though this was greater than 2 years ago. Has not occurred recently. Drinking plenty of fluids.  Memory Screen Patient is able to balance checkbook/bank accounts. Short term memory like a seive. Writes notes to make sure things get done. Does misplace things from time to time.  Patient is alert, normal appearance, oriented to person/place/and time. Correctly identified the president of the [place], recall of 3/3 objects, and performing simple calculations.  Patient displays appropriate judgement and can read correct time from watch face.   Immunizations The following Immunizations are up to date: Influenza, shingles, and tetanus.  Second Pneumovax needed.  Other providers Dermatology twice a year  PCP- Kelly Rumps, MD  Patient also reports several weeks ago having a light pain in his upper back. Felt as though it occurred when he twisted wrong and also with deep breaths. No chest pain or shortness of breath. Went away on its own after several days. Was intermittent. No cough. No fevers. No discomfort since then. Feels well this time.  Active Ambulatory Problems    Diagnosis Date Noted  . Degenerative disk disease 02/13/2013  . BPH (benign prostatic hypertrophy) 02/13/2013  . Actinic keratosis 02/13/2013  . Routine general medical examination at a health care facility 11/16/2013  . Sleep apnea 11/16/2013  . Lightheaded 11/16/2013  . Basal cell carcinoma of skin 01/25/2016  . Thoracic back pain 01/25/2016   Resolved Ambulatory Problems    Diagnosis Date Noted  . No Resolved Ambulatory Problems   Past Medical History  Diagnosis  Date  . Allergy   . Asthma   . Eczema   . Basal cell carcinoma of ear 2007    Family History  Problem Relation Age of Onset  . Stroke Father     Died age 20  . Cancer Sister 96    lymphoma - remission  . Dementia Mother     died age 28 (dementia and osteopororis)  . Hypothyroidism Daughter    . Hypothyroidism Son   . Hypothyroidism Daughter   . Hypothyroidism Daughter     Social History   Social History  . Marital Status: Widowed    Spouse Name: N/A  . Number of Children: 4  . Years of Education: 18   Occupational History  . Not on file.   Social History Main Topics  . Smoking status: Never Smoker   . Smokeless tobacco: Never Used  . Alcohol Use: 7.0 oz/week    14 drink(s) per week     Comment: 1-2 glasses of wine daily  . Drug Use: No  . Sexual Activity: Not on file   Other Topics Concern  . Not on file   Social History Narrative    ROS see history of present illness, otherwise no ROS given annual medicare wellness exam  Objective  Physical Exam Filed Vitals:   01/25/16 0750  BP: 116/64  Pulse: 64  Temp: 98.4 F (36.9 C)    BP Readings from Last 3 Encounters:  01/25/16 116/64  12/02/14 131/73  12/27/13 118/64   Wt Readings from Last 3 Encounters:  01/25/16 149 lb 9.6 oz (67.858 kg)  12/02/14 151 lb 6.4 oz (68.675 kg)  12/27/13 149 lb 12 oz (67.926 kg)   Laying blood pressure 114/66 pulse 56 Sitting blood pressure 110/76 pulse 55 Standing blood pressure 146/82 pulse 64  Physical Exam  Constitutional: He is well-developed, well-nourished, and in no distress.  HENT:  Head: Normocephalic and atraumatic.  Right Ear: External ear normal.  Left Ear: External ear normal.  Cardiovascular: Normal rate, regular rhythm and normal heart sounds.   2+ radial pulses  Pulmonary/Chest: Effort normal and breath sounds normal.  Musculoskeletal: He exhibits no edema.  No midline spine tenderness, no muscular back tenderness, no midline spine step-off  Neurological: He is alert. Gait normal.  5/5 strength in bilateral biceps, triceps, grip, quads, hamstrings, plantar and dorsiflexion, sensation to light touch intact in bilateral UE and LE, normal gait, 2+ patellar reflexes  Skin: Skin is warm and dry. He is not diaphoretic.      Assessment/Plan:    Lightheaded Has not recurred recently. Encouraged fluids. Continue to monitor.  Basal cell carcinoma of skin Followed by dermatology.  Thoracic back pain Had several days of this previously. Resolved on its own. No abnormalities on exam today. Neurologically intact. The lungs sounds clear. Vital signs are stable. Suspect possibly musculoskeletal in nature. Given resolution he'll continue to monitor. If recurs he will let us know. Given return precautions.    Orders Placed This Encounter  Procedures  . Pneumococcal polysaccharide vaccine 23-valent greater than or equal to 2yo subcutaneous/IM  . Basic Metabolic Panel (BMET)  . PSA, Medicare    No orders of the defined types were placed in this encounter.   This is a routine wellness examination for this patient. I reviewed health maintenance protocols. Needed referrals placed. Age and diagnosis appropriate screening labs were ordered. His immunization history was reviewed and appropriate vaccinations were ordered. His current medications and allergies were reviewed and needed refills  of his chronic medications were ordered if needed.   Updated labs as recommended by Medicare: PSA were obtained today.   MEDICARE ATTESTATION I have personally reviewed:  The patient's medical and social history. The use of alcohol, tobacco and illicit drugs. The current medications and supplements. The patient's function ability including ADLs, fall risks, home safety risks, cognitive, and hearing and visual impairment.  Diet and physical activities. Evaluation for depression and mood disorders.   The patient's weight, height, BMI and visual acuity have been recorded in the chart. I have made referrals, counseled and provided education to the patient based on review of the above.   Kelly Rumps, MD [place]

## 2016-01-25 NOTE — Assessment & Plan Note (Addendum)
Has not recurred recently. Suspect likely orthostasis. Single recent fall related to mechanical issue with slipping on a towel in the gym. No other falls in the last year. Encouraged fluids. We'll check BMP. Continue to monitor.

## 2016-01-25 NOTE — Progress Notes (Signed)
Pre visit review using our clinic review tool, if applicable. No additional management support is needed unless otherwise documented below in the visit note. 

## 2016-01-29 ENCOUNTER — Other Ambulatory Visit: Payer: Self-pay | Admitting: Nurse Practitioner

## 2016-02-06 DIAGNOSIS — H2513 Age-related nuclear cataract, bilateral: Secondary | ICD-10-CM | POA: Diagnosis not present

## 2016-03-12 ENCOUNTER — Other Ambulatory Visit: Payer: Self-pay | Admitting: Nurse Practitioner

## 2016-06-06 DIAGNOSIS — Z23 Encounter for immunization: Secondary | ICD-10-CM | POA: Diagnosis not present

## 2016-07-07 ENCOUNTER — Other Ambulatory Visit: Payer: Self-pay | Admitting: Nurse Practitioner

## 2016-07-08 NOTE — Telephone Encounter (Signed)
Refill sent to pharmacy. Patient needs an office visit for further refills of this medication.

## 2016-07-08 NOTE — Telephone Encounter (Signed)
Please advise on refill.

## 2016-07-18 ENCOUNTER — Other Ambulatory Visit: Payer: Self-pay

## 2016-07-23 DIAGNOSIS — L820 Inflamed seborrheic keratosis: Secondary | ICD-10-CM | POA: Diagnosis not present

## 2016-07-23 DIAGNOSIS — L570 Actinic keratosis: Secondary | ICD-10-CM | POA: Diagnosis not present

## 2016-07-23 DIAGNOSIS — Z85828 Personal history of other malignant neoplasm of skin: Secondary | ICD-10-CM | POA: Diagnosis not present

## 2016-07-23 DIAGNOSIS — Z1283 Encounter for screening for malignant neoplasm of skin: Secondary | ICD-10-CM | POA: Diagnosis not present

## 2016-07-23 DIAGNOSIS — L578 Other skin changes due to chronic exposure to nonionizing radiation: Secondary | ICD-10-CM | POA: Diagnosis not present

## 2016-07-23 DIAGNOSIS — D692 Other nonthrombocytopenic purpura: Secondary | ICD-10-CM | POA: Diagnosis not present

## 2016-07-23 DIAGNOSIS — L821 Other seborrheic keratosis: Secondary | ICD-10-CM | POA: Diagnosis not present

## 2016-09-13 DIAGNOSIS — L578 Other skin changes due to chronic exposure to nonionizing radiation: Secondary | ICD-10-CM | POA: Diagnosis not present

## 2016-09-13 DIAGNOSIS — D485 Neoplasm of uncertain behavior of skin: Secondary | ICD-10-CM | POA: Diagnosis not present

## 2016-09-13 DIAGNOSIS — C44329 Squamous cell carcinoma of skin of other parts of face: Secondary | ICD-10-CM | POA: Diagnosis not present

## 2016-09-13 DIAGNOSIS — Z85828 Personal history of other malignant neoplasm of skin: Secondary | ICD-10-CM | POA: Diagnosis not present

## 2016-09-13 DIAGNOSIS — L570 Actinic keratosis: Secondary | ICD-10-CM | POA: Diagnosis not present

## 2016-10-10 ENCOUNTER — Encounter: Payer: Self-pay | Admitting: Family Medicine

## 2016-10-10 ENCOUNTER — Ambulatory Visit (INDEPENDENT_AMBULATORY_CARE_PROVIDER_SITE_OTHER): Payer: Medicare Other | Admitting: Family Medicine

## 2016-10-10 VITALS — BP 120/60 | HR 63 | Temp 97.6°F | Wt 150.0 lb

## 2016-10-10 DIAGNOSIS — R5383 Other fatigue: Secondary | ICD-10-CM

## 2016-10-10 DIAGNOSIS — R55 Syncope and collapse: Secondary | ICD-10-CM

## 2016-10-10 DIAGNOSIS — D492 Neoplasm of unspecified behavior of bone, soft tissue, and skin: Secondary | ICD-10-CM

## 2016-10-10 LAB — CBC
HEMATOCRIT: 43.2 % (ref 39.0–52.0)
HEMOGLOBIN: 14.6 g/dL (ref 13.0–17.0)
MCHC: 33.8 g/dL (ref 30.0–36.0)
MCV: 97.8 fl (ref 78.0–100.0)
PLATELETS: 389 10*3/uL (ref 150.0–400.0)
RBC: 4.42 Mil/uL (ref 4.22–5.81)
RDW: 13 % (ref 11.5–15.5)
WBC: 7.1 10*3/uL (ref 4.0–10.5)

## 2016-10-10 LAB — POCT URINALYSIS DIPSTICK
Bilirubin, UA: NEGATIVE
GLUCOSE UA: NEGATIVE
Ketones, UA: NEGATIVE
Leukocytes, UA: NEGATIVE
NITRITE UA: NEGATIVE
PH UA: 6.5
Protein, UA: NEGATIVE
RBC UA: NEGATIVE
SPEC GRAV UA: 1.01
UROBILINOGEN UA: 0.2

## 2016-10-10 LAB — COMPREHENSIVE METABOLIC PANEL
ALK PHOS: 68 U/L (ref 39–117)
ALT: 57 U/L — AB (ref 0–53)
AST: 35 U/L (ref 0–37)
Albumin: 3.7 g/dL (ref 3.5–5.2)
BILIRUBIN TOTAL: 0.5 mg/dL (ref 0.2–1.2)
BUN: 13 mg/dL (ref 6–23)
CO2: 26 mEq/L (ref 19–32)
Calcium: 9.1 mg/dL (ref 8.4–10.5)
Chloride: 101 mEq/L (ref 96–112)
Creatinine, Ser: 0.98 mg/dL (ref 0.40–1.50)
GFR: 77.55 mL/min (ref >60.00)
GLUCOSE: 96 mg/dL (ref 70–99)
POTASSIUM: 4.3 meq/L (ref 3.5–5.1)
Sodium: 133 mEq/L — ABNORMAL LOW (ref 135–145)
TOTAL PROTEIN: 7.2 g/dL (ref 6.0–8.3)

## 2016-10-10 LAB — SEDIMENTATION RATE: SED RATE: 28 mm/h — AB (ref 0–20)

## 2016-10-10 LAB — TSH: TSH: 1.69 u[IU]/mL (ref 0.35–4.50)

## 2016-10-10 NOTE — Progress Notes (Signed)
Pre visit review using our clinic review tool, if applicable. No additional management support is needed unless otherwise documented below in the visit note. 

## 2016-10-10 NOTE — Progress Notes (Addendum)
Kelly Rumps, MD Phone: [phone]  Kelly Hoover is a 83 y.o. male who presents today for same-day visit.  Patient notes he went on a cruise in the [place] including a stop in [place]. While he was on the cruise he developed a swollen gland on the right side of his neck and then some fatigue where he felt like he needed to sleep all day. He did have some postnasal drip with this. He saw the nurses on the ship and they gave him ibuprofen and the swelling in the gland went down. Postnasal drainage has resolved. He notes his energy level is somewhat improved though does persist. He notes no nausea, vomiting, diarrhea, chest pain, shortness of breath, night sweats, or sick contacts. He does note he is down about 3 pounds since this started. He notes possible fever initially. He additionally notes while he was sitting in the airport he leaned his head back and his girlfriend noted he passed out. When he came to there were a number of police officers, medics, and a Designer, jewellery around him. He had no symptoms prior to this. He thought he was just lying his head back to rest. He has not had any recurrence of this.  PMH: nonsmoker.   ROS see history of present illness  Objective  Physical Exam Vitals:   10/10/16 0951  BP: 120/60  Pulse: 63  Temp: 97.6 F (36.4 C)   Laying blood pressure 138/80 pulse 65 Sitting blood pressure 130/86 pulse 63 Standing blood pressure 122/88 pulse 66  BP Readings from Last 3 Encounters:  10/10/16 120/60  01/25/16 116/64  12/02/14 131/73   Wt Readings from Last 3 Encounters:  10/10/16 150 lb (68 kg)  01/25/16 149 lb 9.6 oz (67.9 kg)  12/02/14 151 lb 6.4 oz (68.7 kg)    Physical Exam  Constitutional: No distress.  HENT:  Head: Normocephalic and atraumatic.  Mouth/Throat: Oropharynx is clear and moist. No oropharyngeal exudate.  Skin colored nodules noted in bilateral ear canals, normal TMs bilaterally  Eyes: Conjunctivae are  normal. Pupils are equal, round, and reactive to light.  Neck: Neck supple.  No masses palpated in his neck or supraclavicular region  Cardiovascular: Normal rate, regular rhythm and normal heart sounds.   Pulmonary/Chest: Effort normal and breath sounds normal.  Abdominal: Soft. Bowel sounds are normal. He exhibits no distension. There is no tenderness. There is no rebound and no guarding.  Musculoskeletal: He exhibits no edema.  Lymphadenopathy:    He has no cervical adenopathy.       Right: No supraclavicular adenopathy present.       Left: No supraclavicular adenopathy present.  Neurological: He is alert. Gait normal.  Skin: Skin is warm and dry. He is not diaphoretic.   EKG: Normal sinus rhythm, rate 62, first-degree AV block with a PR interval of 242, anterior fascicular block, no apparent ischemic changes  Assessment/Plan: Please see individual problem list.  Syncope Patient with potential syncopal episode while he was [place]. He was asymptomatic prior to. He thinks he may have just leaned his head back to rest though he does not remember anything until he came to. EKG today with first-degree AV block. Given lack of symptoms preceding the possible syncopal episode we'll refer to cardiology for evaluation for possible causes.  Fatigue Suspect viral illness given his postnasal drip and swollen glands. No obvious abnormalities on exam today. We will check lab work as outlined below for potential other causes. He'll continue to monitor as  he has been somewhat improving. If he worsens or develops new symptoms he'll follow-up.  Growth of ear canal Noted on exam. We'll refer to ENT for evaluation.   Orders Placed This Encounter  Procedures  . CBC  . Comp Met (CMET)  . TSH  . Sed Rate (ESR)  . Ambulatory referral to Cardiology    Referral Priority:   Routine    Referral Type:   Consultation    Referral Reason:   Specialty Services Required    Requested Specialty:   Cardiology      Number of Visits Requested:   1  . Ambulatory referral to ENT    Referral Priority:   Routine    Referral Type:   Consultation    Referral Reason:   Specialty Services Required    Requested Specialty:   Otolaryngology    Number of Visits Requested:   1  . POCT Urinalysis Dipstick  . EKG 12-Lead    Kelly Rumps, MD [place]

## 2016-10-10 NOTE — Assessment & Plan Note (Signed)
Suspect viral illness given his postnasal drip and swollen glands. No obvious abnormalities on exam today. We will check lab work as outlined below for potential other causes. He'll continue to monitor as he has been somewhat improving. If he worsens or develops new symptoms he'll follow-up.

## 2016-10-10 NOTE — Patient Instructions (Signed)
Nice to see you. We will get some lab work to evaluate your symptoms and call you with the results.  I would advise cardiology referral as well to evaluate for causes of your syncope. If you develop worsening symptoms, recurrent syncope, chest pain, shortness of breath, palpitations, or any new or changing symptoms please seek medical attention immediately.

## 2016-10-10 NOTE — Assessment & Plan Note (Signed)
Patient with potential syncopal episode while he was [place]. He was asymptomatic prior to. He thinks he may have just leaned his head back to rest though he does not remember anything until he came to. EKG today with first-degree AV block. Given lack of symptoms preceding the possible syncopal episode we'll refer to cardiology for evaluation for possible causes.

## 2016-10-12 DIAGNOSIS — D492 Neoplasm of unspecified behavior of bone, soft tissue, and skin: Secondary | ICD-10-CM | POA: Insufficient documentation

## 2016-10-12 NOTE — Assessment & Plan Note (Signed)
Noted on exam. We'll refer to ENT for evaluation.

## 2016-10-12 NOTE — Addendum Note (Signed)
Addended by: Leone Haven on: 10/12/2016 02:58 PM   Modules accepted: Orders

## 2016-10-14 ENCOUNTER — Telehealth: Payer: Self-pay | Admitting: Family Medicine

## 2016-10-14 DIAGNOSIS — R5383 Other fatigue: Secondary | ICD-10-CM

## 2016-10-14 DIAGNOSIS — R7989 Other specified abnormal findings of blood chemistry: Secondary | ICD-10-CM

## 2016-10-14 DIAGNOSIS — R945 Abnormal results of liver function studies: Principal | ICD-10-CM

## 2016-10-14 NOTE — Telephone Encounter (Signed)
Patients lab work was overall reassuring. His white blood cell count was in the normal range. His lymphocytes previously were slightly low and his neutrophils were slightly elevated with an elevated white blood cell count 2 years ago. This potentially could've been related to an infection at that time. We can recheck his CBC with differential with lab work later this week to ensure that his lymphocytes and neutrophils are normal. I still do suspect that his tiredness was related to a viral illness. Thanks.

## 2016-10-14 NOTE — Telephone Encounter (Signed)
Pt called and wanted to speak with Dr. Caryl Bis in regards to his recent visit and results. Pt stated that he just wants tot speak with Dr. Caryl Bis.  Call pt @ [phone]

## 2016-10-14 NOTE — Telephone Encounter (Signed)
Patient notified of lab results, patient looked on mychart and saw the elevated lymphocytes on his lab from 2 years ago, he thought it was his recent labs and he thought this meant lymphoma. Patient states his daughter would like to come up here to talk to Dr.Sonnenberg about patient having serious lab results including lymphoma. Explained to patient that this is related to white blood cells and it may have been elevated back then due to a bacterial illness. Informed patient we did not check this and it has not been checked in 2 years. Informed patient his labs came back normal other then slightly low sodium and slightly elevated liver function. Patient is coming in for recheck Friday.Patient verbalized understanding. Please place order.

## 2016-10-14 NOTE — Telephone Encounter (Signed)
noted 

## 2016-10-18 ENCOUNTER — Other Ambulatory Visit (INDEPENDENT_AMBULATORY_CARE_PROVIDER_SITE_OTHER): Payer: Medicare Other

## 2016-10-18 ENCOUNTER — Encounter: Payer: Self-pay | Admitting: Family Medicine

## 2016-10-18 ENCOUNTER — Telehealth: Payer: Self-pay

## 2016-10-18 ENCOUNTER — Ambulatory Visit (INDEPENDENT_AMBULATORY_CARE_PROVIDER_SITE_OTHER): Payer: Medicare Other | Admitting: Family Medicine

## 2016-10-18 DIAGNOSIS — J309 Allergic rhinitis, unspecified: Secondary | ICD-10-CM | POA: Insufficient documentation

## 2016-10-18 DIAGNOSIS — R5383 Other fatigue: Secondary | ICD-10-CM | POA: Diagnosis not present

## 2016-10-18 DIAGNOSIS — R7989 Other specified abnormal findings of blood chemistry: Secondary | ICD-10-CM

## 2016-10-18 DIAGNOSIS — R945 Abnormal results of liver function studies: Principal | ICD-10-CM

## 2016-10-18 LAB — CBC WITH DIFFERENTIAL/PLATELET
BASOS PCT: 0.5 % (ref 0.0–3.0)
Basophils Absolute: 0 10*3/uL (ref 0.0–0.1)
EOS PCT: 2.1 % (ref 0.0–5.0)
Eosinophils Absolute: 0.2 10*3/uL (ref 0.0–0.7)
HEMATOCRIT: 42.2 % (ref 39.0–52.0)
HEMOGLOBIN: 14.3 g/dL (ref 13.0–17.0)
Lymphocytes Relative: 13.3 % (ref 12.0–46.0)
Lymphs Abs: 1.1 10*3/uL (ref 0.7–4.0)
MCHC: 34 g/dL (ref 30.0–36.0)
MCV: 98.1 fl (ref 78.0–100.0)
MONOS PCT: 15.3 % — AB (ref 3.0–12.0)
Monocytes Absolute: 1.3 10*3/uL — ABNORMAL HIGH (ref 0.1–1.0)
Neutro Abs: 5.8 10*3/uL (ref 1.4–7.7)
Neutrophils Relative %: 68.8 % (ref 43.0–77.0)
Platelets: 446 10*3/uL — ABNORMAL HIGH (ref 150.0–400.0)
RBC: 4.3 Mil/uL (ref 4.22–5.81)
RDW: 12.7 % (ref 11.5–15.5)
WBC: 8.5 10*3/uL (ref 4.0–10.5)

## 2016-10-18 LAB — COMPREHENSIVE METABOLIC PANEL
ALBUMIN: 3.7 g/dL (ref 3.5–5.2)
ALK PHOS: 70 U/L (ref 39–117)
ALT: 20 U/L (ref 0–53)
AST: 19 U/L (ref 0–37)
BUN: 16 mg/dL (ref 6–23)
CO2: 27 mEq/L (ref 19–32)
Calcium: 9.3 mg/dL (ref 8.4–10.5)
Chloride: 104 mEq/L (ref 96–112)
Creatinine, Ser: 1.05 mg/dL (ref 0.40–1.50)
GFR: 71.62 mL/min (ref >60.00)
Glucose, Bld: 94 mg/dL (ref 70–99)
POTASSIUM: 4.6 meq/L (ref 3.5–5.1)
SODIUM: 137 meq/L (ref 135–145)
TOTAL PROTEIN: 7 g/dL (ref 6.0–8.3)
Total Bilirubin: 0.7 mg/dL (ref 0.2–1.2)

## 2016-10-18 MED ORDER — TRIAMCINOLONE ACETONIDE 55 MCG/ACT NA AERO: 2.0000 | INHALATION_SPRAY | Freq: Every day | NASAL | 12 refills | Status: AC

## 2016-10-18 NOTE — Telephone Encounter (Signed)
Patient states he has swollen glands in his throat, he's had this for a couple days, please advise. Patient was wondering why he needed the cardiology referral, informed patient per your note it was for the syncope episode and the first degree AV block. Patient will give them a call back to schedule. He is scheduled for lab today, patient states he will just ask his questions an talk about his swollen glands at that time. Informed patient that it is only a lab appointment and we do have patient this morning and afternoon.

## 2016-10-18 NOTE — Patient Instructions (Signed)
Nice to see you. Your symptoms are likely related to allergies. We will change you to Nasacort for a nasalsteroid spray. If you develop worsening symptoms or any new symptoms please seek medical care immediately.

## 2016-10-18 NOTE — Telephone Encounter (Signed)
-----   Message from Leone Haven, MD sent at 10/18/2016  3:27 PM EST ----- Please let the patient know that his LFTs are normal. His platelets are elevated. This could indicate inflammation from a viral illness. His monocytes are elevated as well which could be a response to infection. I would like to recheck these in 1-2 weeks. Thanks.

## 2016-10-18 NOTE — Telephone Encounter (Signed)
-----   Message from Leone Haven, MD sent at 10/17/2016  7:18 PM EST ----- Regarding: FW: Cardiology referral Can you see what questions the patient has? Thanks. Randall Hiss.    ----- Message ----- From: Arie Sabina Sent: 10/15/2016  10:00 AM To: Leone Haven, MD Subject: Cardiology referral                            Patient does not want to schedule with [facility]  as he has some questions for Dr. Caryl Bis first.

## 2016-10-18 NOTE — Progress Notes (Signed)
Pre-visit discussion using our clinic review tool. No additional management support is needed unless otherwise documented below in the visit note.  

## 2016-10-18 NOTE — Telephone Encounter (Signed)
Left message to return call 

## 2016-10-18 NOTE — Assessment & Plan Note (Signed)
Suspect symptoms are related to allergic rhinitis. We'll change him to Nasacort. Discontinue Flonase. Offered to start him on Claritin though he is hesitant given that he has BPH. I tried to reassure him that typically people do not have issues with this though he still declined. We'll continue to monitor.

## 2016-10-18 NOTE — Progress Notes (Signed)
  [name], MD Phone: [phone]  Kelly Hoover is a 83 y.o. male who presents today for same-day visit.  Patient reports continued mild sore throat. Notes it is on the right side. Feels it when he swallows at times. He notes some postnasal drip. No rhinorrhea, cough, fevers, or any other symptoms with it. He notes it's about the same. He is using cough drops. Does use Flonase. He does note the fatigue he had before resolved.  ROS see history of present illness  Objective  Physical Exam Vitals:   10/18/16 1138  BP: 124/74  Pulse: 65  Resp: 12  Temp: 98.1 F (36.7 C)    BP Readings from Last 3 Encounters:  10/18/16 124/74  10/10/16 120/60  01/25/16 116/64   Wt Readings from Last 3 Encounters:  10/18/16 151 lb 12 oz (68.8 kg)  10/10/16 150 lb (68 kg)  01/25/16 149 lb 9.6 oz (67.9 kg)    Physical Exam  Constitutional: No distress.  HENT:  Head: Normocephalic and atraumatic.  Mouth/Throat: Oropharynx is clear and moist. No oropharyngeal exudate.  No abnormalities noted on inspection of the posterior oropharynx  Eyes: Conjunctivae are normal. Pupils are equal, round, and reactive to light.  Neck: Neck supple.  No palpable abnormalities on palpation of the anterior neck  Cardiovascular: Normal rate, regular rhythm and normal heart sounds.   Pulmonary/Chest: Effort normal and breath sounds normal.  Lymphadenopathy:    He has no cervical adenopathy.  Skin: He is not diaphoretic.     Assessment/Plan: Please see individual problem list.  Allergic rhinitis Suspect symptoms are related to allergic rhinitis. We'll change him to Nasacort. Discontinue Flonase. Offered to start him on Claritin though he is hesitant given that he has BPH. I tried to reassure him that typically people do not have issues with this though he still declined. We'll continue to monitor.   No orders of the defined types were placed in this encounter.   Meds ordered this encounter    Medications  . triamcinolone (NASACORT ALLERGY 24HR) 55 MCG/ACT AERO nasal inhaler    Sig: Place 2 sprays into the nose daily.    Dispense:  1 Inhaler    Refill:  [place], MD [place]

## 2016-10-18 NOTE — Telephone Encounter (Signed)
Seen in the office

## 2016-10-22 DIAGNOSIS — L578 Other skin changes due to chronic exposure to nonionizing radiation: Secondary | ICD-10-CM | POA: Diagnosis not present

## 2016-10-22 DIAGNOSIS — L570 Actinic keratosis: Secondary | ICD-10-CM | POA: Diagnosis not present

## 2016-10-23 NOTE — Telephone Encounter (Signed)
Noted  

## 2016-10-23 NOTE — Telephone Encounter (Signed)
Patient notified and states he does not want to recheck these because he is feeling well right now. Informed patient this is simply a recheck to make sure the elevated labs come back down, patient declined.

## 2016-10-23 NOTE — Telephone Encounter (Signed)
Left message to return call 

## 2016-10-23 NOTE — Telephone Encounter (Signed)
Pt called back returning your call. Thank you!  Call pt @ [phone]

## 2016-11-04 ENCOUNTER — Other Ambulatory Visit: Payer: Self-pay | Admitting: Family Medicine

## 2016-11-18 DIAGNOSIS — R55 Syncope and collapse: Secondary | ICD-10-CM | POA: Diagnosis not present

## 2016-12-06 DIAGNOSIS — R3912 Poor urinary stream: Secondary | ICD-10-CM | POA: Diagnosis not present

## 2016-12-06 DIAGNOSIS — N401 Enlarged prostate with lower urinary tract symptoms: Secondary | ICD-10-CM | POA: Diagnosis not present

## 2016-12-19 DIAGNOSIS — Z7689 Persons encountering health services in other specified circumstances: Secondary | ICD-10-CM | POA: Diagnosis not present

## 2016-12-19 DIAGNOSIS — R9431 Abnormal electrocardiogram [ECG] [EKG]: Secondary | ICD-10-CM | POA: Diagnosis not present

## 2016-12-19 DIAGNOSIS — R55 Syncope and collapse: Secondary | ICD-10-CM | POA: Diagnosis not present

## 2016-12-19 DIAGNOSIS — R5383 Other fatigue: Secondary | ICD-10-CM | POA: Diagnosis not present

## 2016-12-23 DIAGNOSIS — R55 Syncope and collapse: Secondary | ICD-10-CM | POA: Diagnosis not present

## 2017-01-16 DIAGNOSIS — R55 Syncope and collapse: Secondary | ICD-10-CM | POA: Diagnosis not present

## 2017-01-21 DIAGNOSIS — G4733 Obstructive sleep apnea (adult) (pediatric): Secondary | ICD-10-CM | POA: Diagnosis not present

## 2017-01-21 DIAGNOSIS — L308 Other specified dermatitis: Secondary | ICD-10-CM | POA: Diagnosis not present

## 2017-01-21 DIAGNOSIS — E7800 Pure hypercholesterolemia, unspecified: Secondary | ICD-10-CM | POA: Diagnosis not present

## 2017-01-21 DIAGNOSIS — L821 Other seborrheic keratosis: Secondary | ICD-10-CM | POA: Diagnosis not present

## 2017-01-21 DIAGNOSIS — R55 Syncope and collapse: Secondary | ICD-10-CM | POA: Diagnosis not present

## 2017-01-21 DIAGNOSIS — L570 Actinic keratosis: Secondary | ICD-10-CM | POA: Diagnosis not present

## 2017-01-21 DIAGNOSIS — L578 Other skin changes due to chronic exposure to nonionizing radiation: Secondary | ICD-10-CM | POA: Diagnosis not present

## 2017-01-21 DIAGNOSIS — L853 Xerosis cutis: Secondary | ICD-10-CM | POA: Diagnosis not present

## 2017-01-21 DIAGNOSIS — Z85828 Personal history of other malignant neoplasm of skin: Secondary | ICD-10-CM | POA: Diagnosis not present

## 2017-01-23 DIAGNOSIS — G4733 Obstructive sleep apnea (adult) (pediatric): Secondary | ICD-10-CM | POA: Diagnosis not present

## 2017-01-23 DIAGNOSIS — R0602 Shortness of breath: Secondary | ICD-10-CM | POA: Diagnosis not present

## 2017-01-24 DIAGNOSIS — R0602 Shortness of breath: Secondary | ICD-10-CM | POA: Diagnosis not present

## 2017-01-24 DIAGNOSIS — G4733 Obstructive sleep apnea (adult) (pediatric): Secondary | ICD-10-CM | POA: Diagnosis not present

## 2017-01-28 ENCOUNTER — Encounter: Payer: Medicare Other | Admitting: Family Medicine

## 2017-01-28 ENCOUNTER — Ambulatory Visit: Payer: Medicare Other

## 2017-02-20 DIAGNOSIS — G4733 Obstructive sleep apnea (adult) (pediatric): Secondary | ICD-10-CM | POA: Diagnosis not present

## 2017-02-20 DIAGNOSIS — I10 Essential (primary) hypertension: Secondary | ICD-10-CM | POA: Diagnosis not present

## 2017-02-20 DIAGNOSIS — E7800 Pure hypercholesterolemia, unspecified: Secondary | ICD-10-CM | POA: Diagnosis not present

## 2017-03-19 ENCOUNTER — Telehealth: Payer: Self-pay | Admitting: Family Medicine

## 2017-03-19 NOTE — Telephone Encounter (Signed)
Pt has switched to [facility] for PCP services.  [place] removed as PCP in Epic

## 2017-06-07 DIAGNOSIS — Z23 Encounter for immunization: Secondary | ICD-10-CM | POA: Diagnosis not present

## 2017-06-09 DIAGNOSIS — L578 Other skin changes due to chronic exposure to nonionizing radiation: Secondary | ICD-10-CM | POA: Diagnosis not present

## 2017-06-09 DIAGNOSIS — D485 Neoplasm of uncertain behavior of skin: Secondary | ICD-10-CM | POA: Diagnosis not present

## 2017-06-09 DIAGNOSIS — Z85828 Personal history of other malignant neoplasm of skin: Secondary | ICD-10-CM | POA: Diagnosis not present

## 2017-06-09 DIAGNOSIS — L570 Actinic keratosis: Secondary | ICD-10-CM | POA: Diagnosis not present

## 2017-06-09 DIAGNOSIS — L308 Other specified dermatitis: Secondary | ICD-10-CM | POA: Diagnosis not present

## 2017-06-24 DIAGNOSIS — G4733 Obstructive sleep apnea (adult) (pediatric): Secondary | ICD-10-CM | POA: Diagnosis not present

## 2017-06-24 DIAGNOSIS — E7800 Pure hypercholesterolemia, unspecified: Secondary | ICD-10-CM | POA: Diagnosis not present

## 2017-06-24 DIAGNOSIS — I872 Venous insufficiency (chronic) (peripheral): Secondary | ICD-10-CM | POA: Insufficient documentation

## 2017-06-24 DIAGNOSIS — I10 Essential (primary) hypertension: Secondary | ICD-10-CM | POA: Diagnosis not present

## 2017-07-28 DIAGNOSIS — D485 Neoplasm of uncertain behavior of skin: Secondary | ICD-10-CM | POA: Diagnosis not present

## 2017-07-28 DIAGNOSIS — L821 Other seborrheic keratosis: Secondary | ICD-10-CM | POA: Diagnosis not present

## 2017-07-28 DIAGNOSIS — L814 Other melanin hyperpigmentation: Secondary | ICD-10-CM | POA: Diagnosis not present

## 2017-07-28 DIAGNOSIS — L820 Inflamed seborrheic keratosis: Secondary | ICD-10-CM | POA: Diagnosis not present

## 2017-07-28 DIAGNOSIS — D044 Carcinoma in situ of skin of scalp and neck: Secondary | ICD-10-CM | POA: Diagnosis not present

## 2017-07-28 DIAGNOSIS — L853 Xerosis cutis: Secondary | ICD-10-CM | POA: Diagnosis not present

## 2017-07-28 DIAGNOSIS — L578 Other skin changes due to chronic exposure to nonionizing radiation: Secondary | ICD-10-CM | POA: Diagnosis not present

## 2017-07-28 DIAGNOSIS — L570 Actinic keratosis: Secondary | ICD-10-CM | POA: Diagnosis not present

## 2017-07-28 DIAGNOSIS — Z85828 Personal history of other malignant neoplasm of skin: Secondary | ICD-10-CM | POA: Diagnosis not present

## 2017-07-30 DIAGNOSIS — H2513 Age-related nuclear cataract, bilateral: Secondary | ICD-10-CM | POA: Diagnosis not present

## 2017-08-08 DIAGNOSIS — E785 Hyperlipidemia, unspecified: Secondary | ICD-10-CM | POA: Diagnosis not present

## 2017-08-08 DIAGNOSIS — I10 Essential (primary) hypertension: Secondary | ICD-10-CM | POA: Diagnosis not present

## 2017-08-15 DIAGNOSIS — C4491 Basal cell carcinoma of skin, unspecified: Secondary | ICD-10-CM | POA: Diagnosis not present

## 2017-08-15 DIAGNOSIS — G4733 Obstructive sleep apnea (adult) (pediatric): Secondary | ICD-10-CM | POA: Diagnosis not present

## 2017-08-15 DIAGNOSIS — L570 Actinic keratosis: Secondary | ICD-10-CM | POA: Diagnosis not present

## 2017-08-15 DIAGNOSIS — D044 Carcinoma in situ of skin of scalp and neck: Secondary | ICD-10-CM | POA: Diagnosis not present

## 2017-08-15 DIAGNOSIS — N401 Enlarged prostate with lower urinary tract symptoms: Secondary | ICD-10-CM | POA: Diagnosis not present

## 2017-08-15 DIAGNOSIS — X58XXXA Exposure to other specified factors, initial encounter: Secondary | ICD-10-CM | POA: Diagnosis not present

## 2017-08-15 DIAGNOSIS — L578 Other skin changes due to chronic exposure to nonionizing radiation: Secondary | ICD-10-CM | POA: Diagnosis not present

## 2017-08-15 DIAGNOSIS — Z0000 Encounter for general adult medical examination without abnormal findings: Secondary | ICD-10-CM | POA: Diagnosis not present

## 2017-08-15 DIAGNOSIS — L820 Inflamed seborrheic keratosis: Secondary | ICD-10-CM | POA: Diagnosis not present

## 2017-08-15 DIAGNOSIS — R3912 Poor urinary stream: Secondary | ICD-10-CM | POA: Diagnosis not present

## 2017-08-15 DIAGNOSIS — Z23 Encounter for immunization: Secondary | ICD-10-CM | POA: Diagnosis not present

## 2017-12-23 DIAGNOSIS — G4733 Obstructive sleep apnea (adult) (pediatric): Secondary | ICD-10-CM | POA: Diagnosis not present

## 2017-12-23 DIAGNOSIS — E7800 Pure hypercholesterolemia, unspecified: Secondary | ICD-10-CM | POA: Diagnosis not present

## 2017-12-23 DIAGNOSIS — R600 Localized edema: Secondary | ICD-10-CM | POA: Diagnosis not present

## 2017-12-23 DIAGNOSIS — I10 Essential (primary) hypertension: Secondary | ICD-10-CM | POA: Diagnosis not present

## 2018-01-26 DIAGNOSIS — L570 Actinic keratosis: Secondary | ICD-10-CM | POA: Diagnosis not present

## 2018-01-26 DIAGNOSIS — Z85828 Personal history of other malignant neoplasm of skin: Secondary | ICD-10-CM | POA: Diagnosis not present

## 2018-01-26 DIAGNOSIS — L820 Inflamed seborrheic keratosis: Secondary | ICD-10-CM | POA: Diagnosis not present

## 2018-01-26 DIAGNOSIS — L821 Other seborrheic keratosis: Secondary | ICD-10-CM | POA: Diagnosis not present

## 2018-01-26 DIAGNOSIS — L578 Other skin changes due to chronic exposure to nonionizing radiation: Secondary | ICD-10-CM | POA: Diagnosis not present

## 2018-01-26 DIAGNOSIS — L2089 Other atopic dermatitis: Secondary | ICD-10-CM | POA: Diagnosis not present

## 2018-01-26 DIAGNOSIS — D485 Neoplasm of uncertain behavior of skin: Secondary | ICD-10-CM | POA: Diagnosis not present

## 2018-02-13 DIAGNOSIS — I10 Essential (primary) hypertension: Secondary | ICD-10-CM | POA: Diagnosis not present

## 2018-02-13 DIAGNOSIS — N401 Enlarged prostate with lower urinary tract symptoms: Secondary | ICD-10-CM | POA: Diagnosis not present

## 2018-02-13 DIAGNOSIS — R3912 Poor urinary stream: Secondary | ICD-10-CM | POA: Diagnosis not present

## 2018-02-13 DIAGNOSIS — G4733 Obstructive sleep apnea (adult) (pediatric): Secondary | ICD-10-CM | POA: Diagnosis not present

## 2018-02-13 DIAGNOSIS — R739 Hyperglycemia, unspecified: Secondary | ICD-10-CM | POA: Diagnosis not present

## 2018-02-20 DIAGNOSIS — I10 Essential (primary) hypertension: Secondary | ICD-10-CM | POA: Diagnosis not present

## 2018-02-20 DIAGNOSIS — R3912 Poor urinary stream: Secondary | ICD-10-CM | POA: Diagnosis not present

## 2018-02-20 DIAGNOSIS — R739 Hyperglycemia, unspecified: Secondary | ICD-10-CM | POA: Diagnosis not present

## 2018-02-20 DIAGNOSIS — N401 Enlarged prostate with lower urinary tract symptoms: Secondary | ICD-10-CM | POA: Diagnosis not present

## 2018-03-13 ENCOUNTER — Other Ambulatory Visit: Payer: Self-pay

## 2018-03-27 DIAGNOSIS — R21 Rash and other nonspecific skin eruption: Secondary | ICD-10-CM | POA: Diagnosis not present

## 2018-03-27 DIAGNOSIS — N401 Enlarged prostate with lower urinary tract symptoms: Secondary | ICD-10-CM | POA: Diagnosis not present

## 2018-03-27 DIAGNOSIS — B351 Tinea unguium: Secondary | ICD-10-CM | POA: Diagnosis not present

## 2018-03-27 DIAGNOSIS — R3912 Poor urinary stream: Secondary | ICD-10-CM | POA: Diagnosis not present

## 2018-05-07 DIAGNOSIS — L298 Other pruritus: Secondary | ICD-10-CM | POA: Diagnosis not present

## 2018-05-07 DIAGNOSIS — L538 Other specified erythematous conditions: Secondary | ICD-10-CM | POA: Diagnosis not present

## 2018-05-07 DIAGNOSIS — L304 Erythema intertrigo: Secondary | ICD-10-CM | POA: Diagnosis not present

## 2018-05-07 DIAGNOSIS — L820 Inflamed seborrheic keratosis: Secondary | ICD-10-CM | POA: Diagnosis not present

## 2018-05-07 DIAGNOSIS — L308 Other specified dermatitis: Secondary | ICD-10-CM | POA: Diagnosis not present

## 2018-05-28 DIAGNOSIS — Z23 Encounter for immunization: Secondary | ICD-10-CM | POA: Diagnosis not present

## 2018-06-10 DIAGNOSIS — M5090 Cervical disc disorder, unspecified, unspecified cervical region: Secondary | ICD-10-CM | POA: Diagnosis not present

## 2018-06-10 DIAGNOSIS — R030 Elevated blood-pressure reading, without diagnosis of hypertension: Secondary | ICD-10-CM | POA: Diagnosis not present

## 2018-06-22 DIAGNOSIS — R7309 Other abnormal glucose: Secondary | ICD-10-CM | POA: Diagnosis not present

## 2018-06-22 DIAGNOSIS — R600 Localized edema: Secondary | ICD-10-CM | POA: Diagnosis not present

## 2018-06-26 ENCOUNTER — Ambulatory Visit: Admission: RE | Admit: 2018-06-26 | Payer: Medicare Other | Source: Ambulatory Visit

## 2018-06-26 ENCOUNTER — Other Ambulatory Visit: Payer: Self-pay | Admitting: Family Medicine

## 2018-06-26 DIAGNOSIS — M79604 Pain in right leg: Secondary | ICD-10-CM

## 2018-06-26 DIAGNOSIS — M7989 Other specified soft tissue disorders: Secondary | ICD-10-CM | POA: Diagnosis not present

## 2018-06-26 DIAGNOSIS — R7989 Other specified abnormal findings of blood chemistry: Secondary | ICD-10-CM | POA: Diagnosis not present

## 2018-06-27 ENCOUNTER — Emergency Department: Payer: Medicare Other

## 2018-06-27 ENCOUNTER — Emergency Department
Admission: EM | Admit: 2018-06-27 | Discharge: 2018-06-27 | Disposition: A | Discharge status: Home / Self Care | Payer: Medicare Other | Attending: Emergency Medicine | Admitting: Emergency Medicine

## 2018-06-27 ENCOUNTER — Encounter: Payer: Self-pay | Admitting: Emergency Medicine

## 2018-06-27 ENCOUNTER — Other Ambulatory Visit: Payer: Self-pay

## 2018-06-27 DIAGNOSIS — I8001 Phlebitis and thrombophlebitis of superficial vessels of right lower extremity: Secondary | ICD-10-CM | POA: Diagnosis not present

## 2018-06-27 DIAGNOSIS — Z7982 Long term (current) use of aspirin: Secondary | ICD-10-CM | POA: Diagnosis not present

## 2018-06-27 DIAGNOSIS — R2241 Localized swelling, mass and lump, right lower limb: Secondary | ICD-10-CM | POA: Diagnosis present

## 2018-06-27 DIAGNOSIS — M25551 Pain in right hip: Secondary | ICD-10-CM | POA: Diagnosis not present

## 2018-06-27 DIAGNOSIS — I809 Phlebitis and thrombophlebitis of unspecified site: Secondary | ICD-10-CM

## 2018-06-27 DIAGNOSIS — J45909 Unspecified asthma, uncomplicated: Secondary | ICD-10-CM | POA: Insufficient documentation

## 2018-06-27 DIAGNOSIS — M1610 Unilateral primary osteoarthritis, unspecified hip: Secondary | ICD-10-CM

## 2018-06-27 DIAGNOSIS — M7989 Other specified soft tissue disorders: Secondary | ICD-10-CM | POA: Diagnosis not present

## 2018-06-27 DIAGNOSIS — Z79899 Other long term (current) drug therapy: Secondary | ICD-10-CM | POA: Diagnosis not present

## 2018-06-27 DIAGNOSIS — M1990 Unspecified osteoarthritis, unspecified site: Secondary | ICD-10-CM | POA: Insufficient documentation

## 2018-06-27 DIAGNOSIS — M1611 Unilateral primary osteoarthritis, right hip: Secondary | ICD-10-CM | POA: Diagnosis not present

## 2018-06-27 NOTE — ED Triage Notes (Signed)
R leg edema x 1 week. Denies injury.

## 2018-06-27 NOTE — ED Notes (Signed)
Patient transported to X-ray 

## 2018-06-27 NOTE — ED Provider Notes (Signed)
[facility] Emergency Department Provider Note  ____________________________________________   First MD Initiated Contact with Patient 06/27/18 1254     (approximate)  I have reviewed the triage vital signs and the nursing notes.   HISTORY  Chief Complaint Leg Swelling   HPI Kelly Hoover is a 84 y.o. male with a history of basal cell carcinoma who is presenting with right lower extremity swelling.  He says the swelling has been ongoing over the past week.  Says that he also has an aching pain to his right hip that sometimes will keep him up during the night.  Denies any history of DVT.  Is not on any blood thinners.  No long trips lately.  Says that he did fall approximately 1 week ago but believes that the swelling started before the fall.  Says that sometimes he feels that his hip is giving out on him on the right and this is how the fall presented self.  Also says that sometimes he does not walk straight.  However, does not associate this with hip pain.   Past Medical History:  Diagnosis Date  . Actinic keratosis    Followed at [facility] q 6 month  . Allergy    Red meat  . Asthma    as a child  . Basal cell carcinoma of ear 2007   Mohs surgery - followed at [facility] q 6 months  . BPH (benign prostatic hypertrophy)   . Degenerative disk disease    C3-C4, C5-C6  . Eczema     Patient Active Problem List   Diagnosis Date Noted  . Allergic rhinitis 10/18/2016  . Growth of ear canal 10/12/2016  . Syncope 10/10/2016  . Fatigue 10/10/2016  . Basal cell carcinoma of skin 01/25/2016  . Thoracic back pain 01/25/2016  . Routine general medical examination at a health care facility 11/16/2013  . Sleep apnea 11/16/2013  . Lightheaded 11/16/2013  . Degenerative disk disease 02/13/2013  . BPH (benign prostatic hypertrophy) 02/13/2013  . Actinic keratosis 02/13/2013    Past Surgical History:  Procedure Laterality Date  . FINGER SURGERY     left hand  middle and index finger  . MOHS SURGERY     top of right ear  . rectal fistula  1980   repair  . VASECTOMY  1970    Prior to Admission medications   Medication Sig Start Date End Date Taking? Authorizing Provider  aspirin 81 MG tablet Take 81 mg by mouth daily.    [provider]  Carboxymethylcellulose Sodium (THERATEARS OP) Apply to eye as needed.    [provider]  cholecalciferol (VITAMIN D) 1000 UNITS tablet Take 1,000 Units by mouth daily.    [provider]  cyanocobalamin 1000 MCG tablet Take 100 mcg by mouth daily.    [provider]  sodium chloride (OCEAN) 0.65 % nasal spray Place 1 spray into the nose as needed for congestion.    [provider]  tamsulosin (FLOMAX) 0.4 MG CAPS capsule TAKE 1 CAPSULE DAILY 11/04/16   Leone Haven, MD  triamcinolone (NASACORT ALLERGY 24HR) 55 MCG/ACT AERO nasal inhaler Place 2 sprays into the nose daily. 10/18/16   Leone Haven, MD  Triamcinolone Acetonide (TRIAMCINOLONE 0.1 % CREAM : EUCERIN) CREA Apply 1 application topically 2 (two) times daily as needed.    [provider]    Allergies Other  Family History  Problem Relation Age of Onset  . Stroke Father  Died age 58  . Cancer Sister 55       lymphoma - remission  . Dementia Mother        died age 30 (dementia and osteopororis)  . Hypothyroidism Daughter   . Hypothyroidism Son   . Hypothyroidism Daughter   . Hypothyroidism Daughter     Social History Social History   Tobacco Use  . Smoking status: Never Smoker  . Smokeless tobacco: Never Used  Substance Use Topics  . Alcohol use: Yes    Alcohol/week: 14.0 standard drinks    Types: 14 drink(s) per week    Comment: 1-2 glasses of wine daily  . Drug use: No    Review of Systems  Constitutional: No fever/chills Eyes: No visual changes. ENT: No sore throat. Cardiovascular: Denies chest pain. Respiratory: Denies shortness of  breath. Gastrointestinal: No abdominal pain.  No nausea, no vomiting.  No diarrhea.  No constipation. Genitourinary: Negative for dysuria. Musculoskeletal: Negative for back pain. Skin: Negative for rash. Neurological: Negative for headaches, focal weakness or numbness.   ____________________________________________   PHYSICAL EXAM:  VITAL SIGNS: ED Triage Vitals  Enc Vitals Group     BP 06/27/18 1138 (!) 168/74     Pulse Rate 06/27/18 1138 67     Resp 06/27/18 1138 18     Temp 06/27/18 1138 98 F (36.7 C)     Temp Source 06/27/18 1138 Oral     SpO2 06/27/18 1138 97 %     Weight 06/27/18 1140 147 lb (66.7 kg)     Height 06/27/18 1140 5\' 6"  (1.676 m)     Head Circumference --      Peak Flow --      Pain Score 06/27/18 1139 3     Pain Loc --      Pain Edu? --      Excl. in [place]? --     Constitutional: Alert and oriented. Well appearing and in no acute distress. Eyes: Conjunctivae are normal.  Head: Atraumatic. Nose: No congestion/rhinnorhea. Mouth/Throat: Mucous membranes are moist.  Neck: No stridor.   Cardiovascular: Normal rate, regular rhythm. Grossly normal heart sounds.  Good peripheral circulation. Respiratory: Normal respiratory effort.  No retractions. Lungs CTAB. Gastrointestinal: Soft and nontender. No distention. No CVA tenderness. Musculoskeletal: Mild to moderate edema at the right ankle extending to the mid calf on the right.  No induration, warmth, tenderness to palpation.  No bony tenderness.  Full range of motion of the right lower extremity including at the hip, knee and ankle joint.  No tenderness to the knee nor is there any tenderness over the right hip.  No limb shortening.  5 out of 5 strength to the bilateral lower extremities.  Bilateral dorsalis pedis pulses are also intact. Neurologic:  Normal speech and language. No gross focal neurologic deficits are appreciated. Skin:  Skin is warm, dry and intact. No rash noted. Psychiatric: Mood and affect are  normal. Speech and behavior are normal.  ____________________________________________   LABS (all labs ordered are listed, but only abnormal results are displayed)  Labs Reviewed - No data to display ____________________________________________  EKG   ____________________________________________  RADIOLOGY  No evidence of DVT.  Changes consistent with superficial thrombophlebitis in the lesser saphenous vein.  Hip x-ray with degenerative changes in the right hip without acute bony abnormality. ____________________________________________   PROCEDURES  Procedure(s) performed:   Procedures  Critical Care performed:   ____________________________________________   INITIAL IMPRESSION / ASSESSMENT AND PLAN / ED COURSE  Pertinent labs & imaging results that were available during my care of the patient were reviewed by me and considered in my medical decision making (see chart for details).  DDX: DVT, cellulitis, superficial thrombophlebitis, peripheral edema, lymphedema, arthritis As part of my medical decision making, I reviewed the following data within the [facility] Notes from prior outpatient visits  ----------------------------------------- 3:12 PM on 06/27/2018 -----------------------------------------  Discussed the case with Dr. Lorenso Courier of vascular surgery who recommends follow-up in the office as well as treatment for superficial thrombophlebitis with NSAIDs as well as elevation and warm compresses.  Reviewed the imaging results with the patient as well as the plan.  He is understanding and willing to comply.  Will be discharged at this time.  Possible gait/mechanical alteration secondary to arthritis causing stasis and a clot.    ____________________________________________   FINAL CLINICAL IMPRESSION(S) / ED DIAGNOSES  Superficial thrombophlebitis.  Degenerative joint disease.  NEW MEDICATIONS STARTED DURING THIS VISIT:  New Prescriptions    No medications on file     Note:  This document was prepared using Dragon voice recognition software and may include unintentional dictation errors.     Orbie Pyo, MD 06/27/18 [phone]

## 2018-06-27 NOTE — ED Notes (Signed)
Pt reports swelling to right leg x 1 week, pt has pain behind heel at times but relates this to his shoes. Pt states he sees a cardiologist but has no heart history.

## 2018-07-06 ENCOUNTER — Ambulatory Visit (INDEPENDENT_AMBULATORY_CARE_PROVIDER_SITE_OTHER): Payer: Medicare Other | Admitting: Nurse Practitioner

## 2018-07-06 ENCOUNTER — Encounter (INDEPENDENT_AMBULATORY_CARE_PROVIDER_SITE_OTHER): Payer: Self-pay | Admitting: Nurse Practitioner

## 2018-07-06 VITALS — BP 175/78 | HR 64 | Resp 16 | Ht 66.0 in | Wt 149.6 lb

## 2018-07-06 DIAGNOSIS — G8929 Other chronic pain: Secondary | ICD-10-CM | POA: Diagnosis not present

## 2018-07-06 DIAGNOSIS — M546 Pain in thoracic spine: Secondary | ICD-10-CM

## 2018-07-06 DIAGNOSIS — I82811 Embolism and thrombosis of superficial veins of right lower extremities: Secondary | ICD-10-CM

## 2018-07-06 DIAGNOSIS — E785 Hyperlipidemia, unspecified: Secondary | ICD-10-CM | POA: Diagnosis not present

## 2018-07-06 NOTE — Progress Notes (Signed)
Subjective:    Patient ID: Kelly Hoover, male    DOB: 02/09/1931, 84 y.o.   MRN: [id number] Chief Complaint  Patient presents with  . Follow-up    [facility] 3day    HPI  Kelly Hoover is a 84 y.o. male that presented to [facility] on 06/27/2018 with concern for lower extremity swelling.  Patient states that he was in his normal state of health when one day when exercising he had sudden weakness of his right hip which caused it to give way.  He stated that after this.  He noticed that his right lower extremity swollen more than his left.  He also noted that there was pain in his calf area with ambulation.  Upon examination it was found that he had a superficial venous thrombosis of the small saphenous vein.  The patient notes the leg continues to be very painful with dependency and swells quite a bite.  Symptoms are much better with elevation.  The patient notes minimal edema in the morning which steadily worsens throughout the day.    The patient has not been using compression therapy at this point.  No SOB or pleuritic chest pains.  No cough or hemoptysis.  No blood per rectum or blood in any sputum.  No excessive bruising per the patient.  Past Medical History:  Diagnosis Date  . Actinic keratosis    Followed at [facility] q 6 month  . Allergy    Red meat  . Asthma    as a child  . Basal cell carcinoma of ear 2007   Mohs surgery - followed at [facility] q 6 months  . BPH (benign prostatic hypertrophy)   . Degenerative disk disease    C3-C4, C5-C6  . Eczema     Past Surgical History:  Procedure Laterality Date  . FINGER SURGERY     left hand middle and index finger  . MOHS SURGERY     top of right ear  . rectal fistula  1980   repair  . VASECTOMY  1970    Social History   Socioeconomic History  . Marital status: Widowed    Spouse name: Not on file  . Number of children: 4  . Years of education: 61  . Highest education level: Not on file    Occupational History  . Not on file  Social Needs  . Financial resource strain: Not on file  . Food insecurity:    Worry: Not on file    Inability: Not on file  . Transportation needs:    Medical: Not on file    Non-medical: Not on file  Tobacco Use  . Smoking status: Never Smoker  . Smokeless tobacco: Never Used  Substance and Sexual Activity  . Alcohol use: Yes    Alcohol/week: 14.0 standard drinks    Types: 14 drink(s) per week    Comment: 1-2 glasses of wine daily  . Drug use: No  . Sexual activity: Not on file  Lifestyle  . Physical activity:    Days per week: Not on file    Minutes per session: Not on file  . Stress: Not on file  Relationships  . Social connections:    Talks on phone: Not on file    Gets together: Not on file    Attends religious service: Not on file    Active member of club or organization: Not on file    Attends meetings of clubs or organizations: Not  on file    Relationship status: Not on file  . Intimate partner violence:    Fear of current or ex partner: Not on file    Emotionally abused: Not on file    Physically abused: Not on file    Forced sexual activity: Not on file  Other Topics Concern  . Not on file  Social History Narrative  . Not on file    Family History  Problem Relation Age of Onset  . Stroke Father        Died age 28  . Cancer Sister 66       lymphoma - remission  . Dementia Mother        died age 8 (dementia and osteopororis)  . Hypothyroidism Daughter   . Hypothyroidism Son   . Hypothyroidism Daughter   . Hypothyroidism Daughter     Allergies  Allergen Reactions  . Other Other (See Comments)    Year round allergies: (Trees, grasses, molds, airborne bacteria.  Stuffy nasal     Review of Systems   Review of Systems: Negative Unless Checked Constitutional: [] Weight loss  [] Fever  [] Chills Cardiac: [] Chest pain   []  Atrial Fibrillation  [] Palpitations   [] Shortness of breath when laying flat   [] Shortness  of breath with exertion. Vascular:  [] Pain in legs with walking   [] Pain in legs with standing  [] History of DVT   [x] Phlebitis   [x] Swelling in legs   [x] Varicose veins   [] Non-healing ulcers Pulmonary:   [] Uses home oxygen   [] Productive cough   [] Hemoptysis   [] Wheeze  [] COPD   [] Asthma Neurologic:  [] Dizziness   [] Seizures   [] History of stroke   [] History of TIA  [] Aphasia   [] Vissual changes   [] Weakness or numbness in arm   [x] Weakness or numbness in leg Musculoskeletal:   [] Joint swelling   [] Joint pain   [] Low back pain  []  History of Knee Replacement Hematologic:  [x] Easy bruising  [] Easy bleeding   [] Hypercoagulable state   [] Anemic Gastrointestinal:  [] Diarrhea   [] Vomiting  [] Gastroesophageal reflux/heartburn   [] Difficulty swallowing. Genitourinary:  [] Chronic kidney disease   [] Difficult urination  [] Anuric   [] Blood in urine Skin:  [] Rashes   [] Ulcers  Psychological:  [] History of anxiety   []  History of major depression  []  Memory Difficulties     Objective:   Physical Exam  BP (!) 175/78 (BP Location: Right Arm)   Pulse 64   Resp 16   Ht 5\' 6"  (1.676 m)   Wt 149 lb 9.6 oz (67.9 kg)   BMI 24.15 kg/m   Gen: WD/WN, NAD Head: [place]/AT, No temporalis wasting.  Ear/Nose/Throat: Hearing grossly intact, nares w/o erythema or drainage Eyes: PER, EOMI, sclera nonicteric.  Neck: Supple, no masses.  No JVD.  Pulmonary:  Good air movement, no use of accessory muscles.  Cardiac: RRR Vascular:  Right leg swollen (2+ soft edema) compared to the left Vessel Right Left  Radial Palpable Palpable   Gastrointestinal: soft, non-distended. No guarding/no peritoneal signs.  Musculoskeletal: Uses cane for ambulation No deformity or atrophy.  Neurologic: Pain and light touch intact in extremities.  Symmetrical.  Speech is fluent. Motor exam as listed above. Psychiatric: Judgment intact, Mood & affect appropriate for pt's clinical situation. Dermatologic: No Venous rashes. No Ulcers Noted.   No changes consistent with cellulitis. Lymph : No Cervical lymphadenopathy, no lichenification or skin changes of chronic lymphedema.      Assessment & Plan:   1. Acute superficial  venous thrombosis of lower extremity, right Patient was found to have a superficial venous thrombosis of the small saphenous vein.  The patient continues to have some leg swelling.  The patient will wear graduated compression stockings class 1 (20-30 mmHg), patient was advised to use NSAIDs for pain.  He is also advised to utilize warm compresses on the area.  The patient will  beginning wearing the stockings first thing in the morning and removing them in the evening. The patient is instructed specifically not to sleep in the stockings.  In addition, behavioral modification including elevation during the day and avoidance of prolonged dependency will be initiated.    The patient will follow-up in 3 months with repeat ultrasound to evaluate superficial venous thrombosis.  - VAS [place] LOWER EXTREMITY VENOUS (DVT); Future  2. Chronic bilateral thoracic back pain Continue NSAID medications as already ordered, these medications have been reviewed and there are no changes at this time.  Continued activity and therapy was stressed.   3. Hyperlipidemia, unspecified hyperlipidemia type Continue statin as ordered and reviewed, no changes at this time    Current Outpatient Medications on File Prior to Visit  Medication Sig Dispense Refill  . aspirin 81 MG tablet Take 81 mg by mouth daily.    . Carboxymethylcellulose Sodium (THERATEARS OP) Apply to eye as needed.    . cholecalciferol (VITAMIN D) 1000 UNITS tablet Take 1,000 Units by mouth daily.    . cyanocobalamin 1000 MCG tablet Take 100 mcg by mouth daily.    . sodium chloride (OCEAN) 0.65 % nasal spray Place 1 spray into the nose as needed for congestion.    . tamsulosin (FLOMAX) 0.4 MG CAPS capsule TAKE 1 CAPSULE DAILY 90 capsule 0  . triamcinolone (NASACORT  ALLERGY 24HR) 55 MCG/ACT AERO nasal inhaler Place 2 sprays into the nose daily. 1 Inhaler 12  . Triamcinolone Acetonide (TRIAMCINOLONE 0.1 % CREAM : EUCERIN) CREA Apply 1 application topically 2 (two) times daily as needed.     No current facility-administered medications on file prior to visit.     There are no Patient Instructions on file for this visit. Return in about 3 months (around 10/06/2018).   Kris Hartmann, NP  This note was completed with Sales executive.  Any errors are purely unintentional.

## 2018-07-13 DIAGNOSIS — M25551 Pain in right hip: Secondary | ICD-10-CM | POA: Diagnosis not present

## 2018-07-15 DIAGNOSIS — M25551 Pain in right hip: Secondary | ICD-10-CM | POA: Diagnosis not present

## 2018-07-17 DIAGNOSIS — I10 Essential (primary) hypertension: Secondary | ICD-10-CM | POA: Diagnosis not present

## 2018-07-17 DIAGNOSIS — R55 Syncope and collapse: Secondary | ICD-10-CM | POA: Diagnosis not present

## 2018-07-20 DIAGNOSIS — M25551 Pain in right hip: Secondary | ICD-10-CM | POA: Diagnosis not present

## 2018-07-22 DIAGNOSIS — M25551 Pain in right hip: Secondary | ICD-10-CM | POA: Diagnosis not present

## 2018-07-27 ENCOUNTER — Emergency Department
Admission: EM | Admit: 2018-07-27 | Discharge: 2018-07-27 | Disposition: A | Discharge status: Home / Self Care | Payer: Medicare Other | Attending: Emergency Medicine | Admitting: Emergency Medicine

## 2018-07-27 ENCOUNTER — Other Ambulatory Visit: Payer: Self-pay

## 2018-07-27 ENCOUNTER — Encounter: Payer: Self-pay | Admitting: Emergency Medicine

## 2018-07-27 ENCOUNTER — Emergency Department: Payer: Medicare Other

## 2018-07-27 DIAGNOSIS — I10 Essential (primary) hypertension: Secondary | ICD-10-CM | POA: Diagnosis not present

## 2018-07-27 DIAGNOSIS — M25551 Pain in right hip: Secondary | ICD-10-CM | POA: Diagnosis not present

## 2018-07-27 DIAGNOSIS — R55 Syncope and collapse: Secondary | ICD-10-CM | POA: Diagnosis not present

## 2018-07-27 DIAGNOSIS — S0101XA Laceration without foreign body of scalp, initial encounter: Secondary | ICD-10-CM | POA: Diagnosis not present

## 2018-07-27 DIAGNOSIS — S0990XA Unspecified injury of head, initial encounter: Secondary | ICD-10-CM | POA: Diagnosis not present

## 2018-07-27 DIAGNOSIS — Z7982 Long term (current) use of aspirin: Secondary | ICD-10-CM | POA: Diagnosis not present

## 2018-07-27 DIAGNOSIS — J45909 Unspecified asthma, uncomplicated: Secondary | ICD-10-CM | POA: Diagnosis not present

## 2018-07-27 DIAGNOSIS — W19XXXA Unspecified fall, initial encounter: Secondary | ICD-10-CM

## 2018-07-27 DIAGNOSIS — I951 Orthostatic hypotension: Secondary | ICD-10-CM | POA: Diagnosis not present

## 2018-07-27 DIAGNOSIS — Z79899 Other long term (current) drug therapy: Secondary | ICD-10-CM | POA: Insufficient documentation

## 2018-07-27 LAB — COMPREHENSIVE METABOLIC PANEL
ALBUMIN: 3.7 g/dL (ref 3.5–5.0)
ALT: 18 U/L (ref 0–44)
AST: 22 U/L (ref 15–41)
Alkaline Phosphatase: 56 U/L (ref 38–126)
Anion gap: 6 (ref 5–15)
BUN: 22 mg/dL (ref 8–23)
CO2: 27 mmol/L (ref 22–32)
Calcium: 9.1 mg/dL (ref 8.9–10.3)
Chloride: 104 mmol/L (ref 98–111)
Creatinine, Ser: 1.18 mg/dL (ref 0.61–1.24)
GFR calc Af Amer: >60 mL/min (ref >60)
GFR calc non Af Amer: 56 mL/min — ABNORMAL LOW (ref >60)
GLUCOSE: 142 mg/dL — AB (ref 70–99)
Potassium: 4.2 mmol/L (ref 3.5–5.1)
Sodium: 137 mmol/L (ref 135–145)
Total Bilirubin: 0.9 mg/dL (ref 0.3–1.2)
Total Protein: 6.6 g/dL (ref 6.5–8.1)

## 2018-07-27 LAB — CBC
HCT: 42.9 % (ref 39.0–52.0)
Hemoglobin: 14.5 g/dL (ref 13.0–17.0)
MCH: 33.1 pg (ref 26.0–34.0)
MCHC: 33.8 g/dL (ref 30.0–36.0)
MCV: 97.9 fL (ref 80.0–100.0)
Platelets: 282 10*3/uL (ref 150–400)
RBC: 4.38 MIL/uL (ref 4.22–5.81)
RDW: 11.9 % (ref 11.5–15.5)
WBC: 6.9 10*3/uL (ref 4.0–10.5)
nRBC: 0 % (ref 0.0–0.2)

## 2018-07-27 LAB — TROPONIN I: Troponin I: <0.03 ng/mL (ref <0.03)

## 2018-07-27 MED ORDER — LIDOCAINE-EPINEPHRINE-TETRACAINE (LET) SOLUTION
NASAL | Status: AC
  Administered 2018-07-27: 3 mL via TOPICAL
  Filled 2018-07-27: qty 3

## 2018-07-27 MED ORDER — LIDOCAINE-EPINEPHRINE-TETRACAINE (LET) SOLUTION
3.0000 mL | Freq: Once | NASAL | Status: AC
  Administered 2018-07-27: 3 mL via TOPICAL

## 2018-07-27 NOTE — ED Triage Notes (Signed)
States got dizzy and fell to floor, abrasion back of head. Now alert and oriented, denies total LOC.

## 2018-07-27 NOTE — ED Notes (Signed)
Pt states he was cleaning up in the kitchen and passed out falling back and hitting his head no the hard floor. Pt has a lac to the posterior head with clean bandage in place. Pt states he had just taken his heart monitor off to charge it when he had the episode, states his cardiologist had him put on it for monitoring for two weeks. Pt is a/ox4 on arrival, pt neighbor is at the bedside.

## 2018-07-27 NOTE — ED Triage Notes (Signed)
First Nurse Note:  C/O fall today.  Patient has had recent history of near syncopal episodes.  Has been wearing a holter monitor, but monitor was off during event.  Patient is AAOx3.  Skin warm and dry.  MAE equally and strong.  Abrasion to back of head.

## 2018-07-27 NOTE — Discharge Instructions (Addendum)
Please follow-up with your cardiologist regarding your ER visit today.  Staples have been placed in the back of your head to repair a cut.  These will need to be removed in 10 days.  As we discussed please verify with your primary care doctor that your tetanus is up-to-date.  Return to the emergency department for any symptoms personally concerning to yourself.

## 2018-07-27 NOTE — ED Provider Notes (Signed)
[facility] Emergency Department Provider Note  Time seen: 5:27 PM  I have reviewed the triage vital signs and the nursing notes.   HISTORY  Chief Complaint Fall and Near Syncope    HPI Kelly Hoover is a 84 y.o. male with a past medical history of asthma, near syncopal episodes, presents to the emergency department after a fall and head injury.  According to the patient he has a history of near syncopal episodes, currently has a Holter monitor which he is supposed to wear for 2 weeks and then turned back in to his cardiologist Dr. Nehemiah Massed.  Unfortunately today the Holter monitor was charging when the patient was cleaning in the kitchen, states he was crouched down cleaning, when he stood up he became lightheaded fell backwards hitting the back of his head is not sure if he completely lost consciousness or not.  Denies any chest pain or trouble breathing at any point.  Patient denies any use of blood thinners.  Patient did no bleeding from the back of the head so he came to the emergency department for evaluation.   Past Medical History:  Diagnosis Date  . Actinic keratosis    Followed at [facility] q 6 month  . Allergy    Red meat  . Asthma    as a child  . Basal cell carcinoma of ear 2007   Mohs surgery - followed at [facility] q 6 months  . BPH (benign prostatic hypertrophy)   . Degenerative disk disease    C3-C4, C5-C6  . Eczema     Patient Active Problem List   Diagnosis Date Noted  . Venous insufficiency of both lower extremities 06/24/2017  . Benign essential HTN 02/20/2017  . Allergic rhinitis 10/18/2016  . Growth of ear canal 10/12/2016  . Syncope 10/10/2016  . Fatigue 10/10/2016  . Basal cell carcinoma of skin 01/25/2016  . Thoracic back pain 01/25/2016  . Routine general medical examination at a health care facility 11/16/2013  . Sleep apnea 11/16/2013  . Lightheaded 11/16/2013  . Degenerative disk disease 02/13/2013  . BPH (benign  prostatic hypertrophy) 02/13/2013  . Actinic keratosis 02/13/2013  . Hyperlipidemia 05/14/2012    Past Surgical History:  Procedure Laterality Date  . FINGER SURGERY     left hand middle and index finger  . MOHS SURGERY     top of right ear  . rectal fistula  1980   repair  . VASECTOMY  1970    Prior to Admission medications   Medication Sig Start Date End Date Taking? Authorizing Provider  aspirin 81 MG tablet Take 81 mg by mouth daily.    [provider]  Carboxymethylcellulose Sodium (THERATEARS OP) Apply to eye as needed.    [provider]  cholecalciferol (VITAMIN D) 1000 UNITS tablet Take 1,000 Units by mouth daily.    [provider]  cyanocobalamin 1000 MCG tablet Take 100 mcg by mouth daily.    [provider]  sodium chloride (OCEAN) 0.65 % nasal spray Place 1 spray into the nose as needed for congestion.    [provider]  tamsulosin (FLOMAX) 0.4 MG CAPS capsule TAKE 1 CAPSULE DAILY 11/04/16   Leone Haven, MD  triamcinolone (NASACORT ALLERGY 24HR) 55 MCG/ACT AERO nasal inhaler Place 2 sprays into the nose daily. 10/18/16   Leone Haven, MD  Triamcinolone Acetonide (TRIAMCINOLONE 0.1 % CREAM : EUCERIN) CREA Apply 1 application topically 2 (two) times daily as needed.  [provider]    Allergies  Allergen Reactions  . Other Other (See Comments)    Year round allergies: (Trees, grasses, molds, airborne bacteria.  Stuffy nasal    Family History  Problem Relation Age of Onset  . Stroke Father        Died age 3  . Cancer Sister 53       lymphoma - remission  . Dementia Mother        died age 81 (dementia and osteopororis)  . Hypothyroidism Daughter   . Hypothyroidism Son   . Hypothyroidism Daughter   . Hypothyroidism Daughter     Social History Social History   Tobacco Use  . Smoking status: Never Smoker  . Smokeless tobacco: Never Used  Substance Use Topics  . Alcohol use: Yes     Alcohol/week: 14.0 standard drinks    Types: 14 drink(s) per week    Comment: 1-2 glasses of wine daily  . Drug use: No    Review of Systems Constitutional: Negative for fever. Cardiovascular: Negative for chest pain. Respiratory: Negative for shortness of breath. Gastrointestinal: Negative for abdominal pain Genitourinary: Negative for urinary compaints Musculoskeletal: Negative for musculoskeletal complaints Skin: Bleeding to the back of the head. Neurological: Negative for headache All other ROS negative  ____________________________________________   PHYSICAL EXAM:  VITAL SIGNS: ED Triage Vitals  Enc Vitals Group     BP 07/27/18 1529 126/61     Pulse Rate 07/27/18 1529 75     Resp 07/27/18 1529 18     Temp 07/27/18 1529 97.9 F (36.6 C)     Temp Source 07/27/18 1529 Oral     SpO2 07/27/18 1529 95 %     Weight 07/27/18 1530 147 lb (66.7 kg)     Height 07/27/18 1530 5\' 6"  (1.676 m)     Head Circumference --      Peak Flow --      Pain Score 07/27/18 1529 5     Pain Loc --      Pain Edu? --      Excl. in Accident? --    Constitutional: Alert and oriented. Well appearing and in no distress. Eyes: Normal exam ENT   Head: Patient has a 4 cm laceration in a V shape to the back of the head.  Hemostatic.   Mouth/Throat: Mucous membranes are moist. Cardiovascular: Normal rate, regular rhythm. Respiratory: Normal respiratory effort without tachypnea nor retractions. Breath sounds are clear  Gastrointestinal: Soft and nontender. No distention.   Musculoskeletal: Nontender with normal range of motion in all extremities.  Neurologic:  Normal speech and language. No gross focal neurologic deficits Skin:  Skin is warm.  Laceration to occipital scalp. Psychiatric: Mood and affect are normal.  ____________________________________________    EKG  EKG viewed and interpreted by myself shows a normal sinus rhythm at 69 bpm with a narrow QRS, normal axis, PR prolongation  otherwise normal intervals.  No concerning ST changes.  ____________________________________________    RADIOLOGY  CT negative for acute abnormality  ____________________________________________   INITIAL IMPRESSION / ASSESSMENT AND PLAN / ED COURSE  Pertinent labs & imaging results that were available during my care of the patient were reviewed by me and considered in my medical decision making (see chart for details).  Patient presents to the emergency department after near syncopal episode/syncopal episode falling and hitting the back of his head.  Patient's work-up is normal including blood work EKG and CT scan of the head.  Differential  at this time could include arrhythmia, more likely orthostatic hypotension.  I discussed with the patient continuing to wear his Holter monitor and following back up with his cardiologist.  Patient's laceration will require staple repair we will apply let and then repair with approximately 4 staples.  Patient believes his tetanus is up-to-date, but he will follow-up with his PCP regarding this.  LACERATION REPAIR Performed by: Harvest Dark Authorized by: Harvest Dark Consent: Verbal consent obtained. Risks and benefits: risks, benefits and alternatives were discussed Consent given by: patient Patient identity confirmed: provided demographic data Prepped and Draped in normal sterile fashion Wound explored  Laceration Location: occipital scalp  Laceration Length: 4 cm  No Foreign Bodies seen or palpated  Anesthesia: local infiltration  Local anesthetic: LET (topical)  Anesthetic total: 5 ml  Irrigation method: syringe Amount of cleaning: standard  Skin closure: Staples  Number of sutures: 3  Technique: Staples  Patient tolerance: Patient tolerated the procedure well with no immediate complications.   ____________________________________________   FINAL CLINICAL IMPRESSION(S) / ED DIAGNOSES  Near  syncope Orthostatic hypotension Laceration    Harvest Dark, MD 07/27/18 [phone]

## 2018-07-29 DIAGNOSIS — R55 Syncope and collapse: Secondary | ICD-10-CM | POA: Diagnosis not present

## 2018-08-04 DIAGNOSIS — R55 Syncope and collapse: Secondary | ICD-10-CM | POA: Diagnosis not present

## 2018-08-07 DIAGNOSIS — S0101XD Laceration without foreign body of scalp, subsequent encounter: Secondary | ICD-10-CM | POA: Diagnosis not present

## 2018-09-02 DIAGNOSIS — I4891 Unspecified atrial fibrillation: Secondary | ICD-10-CM

## 2018-09-02 HISTORY — DX: Unspecified atrial fibrillation: I48.91

## 2018-09-23 ENCOUNTER — Ambulatory Visit (INDEPENDENT_AMBULATORY_CARE_PROVIDER_SITE_OTHER): Payer: Medicare Other | Admitting: Nurse Practitioner

## 2018-09-23 ENCOUNTER — Encounter (INDEPENDENT_AMBULATORY_CARE_PROVIDER_SITE_OTHER): Payer: Self-pay | Admitting: Nurse Practitioner

## 2018-09-23 ENCOUNTER — Ambulatory Visit (INDEPENDENT_AMBULATORY_CARE_PROVIDER_SITE_OTHER): Payer: Medicare Other

## 2018-09-23 ENCOUNTER — Other Ambulatory Visit: Payer: Self-pay

## 2018-09-23 VITALS — BP 147/75 | HR 60 | Resp 10 | Ht 66.0 in | Wt 150.0 lb

## 2018-09-23 DIAGNOSIS — I10 Essential (primary) hypertension: Secondary | ICD-10-CM | POA: Diagnosis not present

## 2018-09-23 DIAGNOSIS — I82811 Embolism and thrombosis of superficial veins of right lower extremities: Secondary | ICD-10-CM | POA: Diagnosis not present

## 2018-09-23 DIAGNOSIS — E785 Hyperlipidemia, unspecified: Secondary | ICD-10-CM | POA: Diagnosis not present

## 2018-10-04 ENCOUNTER — Encounter (INDEPENDENT_AMBULATORY_CARE_PROVIDER_SITE_OTHER): Payer: Self-pay | Admitting: Nurse Practitioner

## 2018-10-04 DIAGNOSIS — I82811 Embolism and thrombosis of superficial veins of right lower extremities: Secondary | ICD-10-CM | POA: Insufficient documentation

## 2018-10-04 NOTE — Progress Notes (Signed)
SUBJECTIVE:  Patient ID: Kelly Hoover, male    DOB: 1931-01-01, 85 y.o.   MRN: [id number] Chief Complaint  Patient presents with  . Follow-up    HPI  Kelly Hoover is a 85 y.o. male that presents for follow-up regarding a superficial venous thrombosis.  The patient's main complaint at this time is that his right lower extremity continues to swell after diagnosis.  The patient denies utilizing any medical grade 1 compression stockings.  He denies using elevation when he is able.  He denies any pain of lower extremity.  Patient denies any fever, chills, nausea, vomiting or diarrhea.  Denies any chest pain or shortness of breath.  Duplex ultrasound today shows calcification of his superficial venous thrombosis present in the lesser saphenous vein of his right lower extremity.  Past Medical History:  Diagnosis Date  . Actinic keratosis    Followed at [facility] q 6 month  . Allergy    Red meat  . Asthma    as a child  . Atrial fibrillation ([place]) 09/02/2018  . Basal cell carcinoma of ear 2007   Mohs surgery - followed at [facility] q 6 months  . BPH (benign prostatic hypertrophy)   . Degenerative disk disease    C3-C4, C5-C6  . Eczema     Past Surgical History:  Procedure Laterality Date  . FINGER SURGERY     left hand middle and index finger  . MOHS SURGERY     top of right ear  . rectal fistula  1980   repair  . VASECTOMY  1970    Social History   Socioeconomic History  . Marital status: Widowed    Spouse name: Not on file  . Number of children: 4  . Years of education: 57  . Highest education level: Not on file  Occupational History  . Not on file  Social Needs  . Financial resource strain: Not on file  . Food insecurity:    Worry: Not on file    Inability: Not on file  . Transportation needs:    Medical: Not on file    Non-medical: Not on file  Tobacco Use  . Smoking status: Never Smoker  . Smokeless tobacco: Never Used  Substance and Sexual Activity  .  Alcohol use: Yes    Alcohol/week: 14.0 standard drinks    Types: 14 drink(s) per week    Comment: 1-2 glasses of wine daily  . Drug use: No  . Sexual activity: Not on file  Lifestyle  . Physical activity:    Days per week: Not on file    Minutes per session: Not on file  . Stress: Not on file  Relationships  . Social connections:    Talks on phone: Not on file    Gets together: Not on file    Attends religious service: Not on file    Active member of club or organization: Not on file    Attends meetings of clubs or organizations: Not on file    Relationship status: Not on file  . Intimate partner violence:    Fear of current or ex partner: Not on file    Emotionally abused: Not on file    Physically abused: Not on file    Forced sexual activity: Not on file  Other Topics Concern  . Not on file  Social History Narrative  . Not on file    Family History  Problem Relation Age of Onset  . Stroke Father  Died age 68  . Cancer Sister 33       lymphoma - remission  . Dementia Mother        died age 78 (dementia and osteopororis)  . Hypothyroidism Daughter   . Hypothyroidism Son   . Hypothyroidism Daughter   . Hypothyroidism Daughter     Allergies  Allergen Reactions  . Other Other (See Comments)    Year round allergies: (Trees, grasses, molds, airborne bacteria.  Stuffy nasal     Review of Systems   Review of Systems: Negative Unless Checked Constitutional: [] Weight loss  [] Fever  [] Chills Cardiac: [] Chest pain   []  Atrial Fibrillation  [] Palpitations   [] Shortness of breath when laying flat   [] Shortness of breath with exertion. [] Shortness of breath at rest Vascular:  [] Pain in legs with walking   [] Pain in legs with standing [] Pain in legs when laying flat   [] Claudication    [] Pain in feet when laying flat    [] History of DVT   [] Phlebitis   [x] Swelling in legs   [x] Varicose veins   [] Non-healing ulcers Pulmonary:   [] Uses home oxygen   [] Productive cough    [] Hemoptysis   [] Wheeze  [] COPD   [] Asthma Neurologic:  [] Dizziness   [] Seizures  [] Blackouts [] History of stroke   [] History of TIA  [] Aphasia   [] Temporary Blindness   [] Weakness or numbness in arm   [x] Weakness or numbness in leg Musculoskeletal:   [] Joint swelling   [] Joint pain   [] Low back pain  []  History of Knee Replacement [] Arthritis [] back Surgeries  []  Spinal Stenosis    Hematologic:  [] Easy bruising  [] Easy bleeding   [] Hypercoagulable state   [] Anemic Gastrointestinal:  [] Diarrhea   [] Vomiting  [] Gastroesophageal reflux/heartburn   [] Difficulty swallowing. [] Abdominal pain Genitourinary:  [] Chronic kidney disease   [] Difficult urination  [] Anuric   [] Blood in urine [] Frequent urination  [] Burning with urination   [] Hematuria Skin:  [] Rashes   [] Ulcers [] Wounds Psychological:  [] History of anxiety   []  History of major depression  []  Memory Difficulties      OBJECTIVE:   Physical Exam  BP (!) 147/75 (BP Location: Left Arm, Patient Position: Sitting, Cuff Size: Small)   Pulse 60   Resp 10   Ht 5\' 6"  (1.676 m)   Wt 150 lb (68 kg)   BMI 24.21 kg/m   Gen: WD/WN, NAD Head: [place]/AT, No temporalis wasting.  Ear/Nose/Throat: Hearing grossly intact, nares w/o erythema or drainage Eyes: PER, EOMI, sclera nonicteric.  Neck: Supple, no masses.  No JVD.  Pulmonary:  Good air movement, no use of accessory muscles.  Cardiac: RRR Vascular: 2+ soft edema of right leg Vessel Right Left  Radial Palpable Palpable   Gastrointestinal: soft, non-distended. No guarding/no peritoneal signs.  Musculoskeletal: M/S 5/5 throughout.  No deformity or atrophy.  Neurologic: Pain and light touch intact in extremities.  Symmetrical.  Speech is fluent. Motor exam as listed above. Psychiatric: Judgment intact, Mood & affect appropriate for pt's clinical situation. Dermatologic: No Venous rashes. No Ulcers Noted.  No changes consistent with cellulitis. Lymph : No Cervical lymphadenopathy, no  lichenification or skin changes of chronic lymphedema.       ASSESSMENT AND PLAN:  1. Chronic superficial venous thrombosis of lower extremity, right   I have had a long discussion with the patient regarding SVT and post phlebitic changes such as swelling and why it  causes symptoms such as pain.  The patient will wear graduated compression stockings class 1 (20-30  mmHg),  The patient will  beginning wearing the stockings first thing in the morning and removing them in the evening. The patient is instructed specifically not to sleep in the stockings.  In addition, behavioral modification including elevation during the day and avoidance of prolonged dependency will be initiated.    2. Benign essential HTN Continue antihypertensive medications as already ordered, these medications have been reviewed and there are no changes at this time.   3. Hyperlipidemia, unspecified hyperlipidemia type Continue statin as ordered and reviewed, no changes at this time    Current Outpatient Medications on File Prior to Visit  Medication Sig Dispense Refill  . amiodarone (PACERONE) 200 MG tablet Take 200 mg by mouth daily.    . Carboxymethylcellulose Sodium (THERATEARS OP) Apply to eye as needed.    . cholecalciferol (VITAMIN D) 1000 UNITS tablet Take 1,000 Units by mouth daily.    . cyanocobalamin 1000 MCG tablet Take 100 mcg by mouth daily.    . tamsulosin (FLOMAX) 0.4 MG CAPS capsule TAKE 1 CAPSULE DAILY 90 capsule 0  . Triamcinolone Acetonide (TRIAMCINOLONE 0.1 % CREAM : EUCERIN) CREA Apply 1 application topically 2 (two) times daily as needed.    Marland Kitchen aspirin 81 MG tablet Take 81 mg by mouth daily.    . sodium chloride (OCEAN) 0.65 % nasal spray Place 1 spray into the nose as needed for congestion.    . triamcinolone (NASACORT ALLERGY 24HR) 55 MCG/ACT AERO nasal inhaler Place 2 sprays into the nose daily. (Patient not taking: Reported on 09/23/2018) 1 Inhaler 12   No current facility-administered  medications on file prior to visit.     There are no Patient Instructions on file for this visit. No follow-ups on file.   Kris Hartmann, NP  This note was completed with Sales executive.  Any errors are purely unintentional.

## 2018-12-22 ENCOUNTER — Ambulatory Visit (INDEPENDENT_AMBULATORY_CARE_PROVIDER_SITE_OTHER): Payer: Medicare Other | Admitting: Vascular Surgery

## 2018-12-28 ENCOUNTER — Telehealth (INDEPENDENT_AMBULATORY_CARE_PROVIDER_SITE_OTHER): Payer: Self-pay | Admitting: Vascular Surgery

## 2018-12-28 NOTE — Telephone Encounter (Signed)
Patient symptoms are swelling with some red coloring around ankle area. No pain, no bruising, no wounds. Patient would like to be seen. Please advise. AS, CMA

## 2018-12-28 NOTE — Telephone Encounter (Signed)
Patient is aware of the below. Please call and schedule patient apt for sooner. AS, CMA

## 2018-12-28 NOTE — Telephone Encounter (Signed)
The patient should try conservative treatment first, such as elevating and wearing compression stockings.  This would be the first recommendation if the patient was seen in office, as there is generally not any medications we would prescribe just for the swelling.  Please advise the patient to utilize conservative treatment first and we can move his appointment sooner, Tammy-please keep his appointment with [facility]

## 2018-12-29 ENCOUNTER — Encounter (INDEPENDENT_AMBULATORY_CARE_PROVIDER_SITE_OTHER): Payer: Self-pay | Admitting: Vascular Surgery

## 2018-12-29 ENCOUNTER — Ambulatory Visit (INDEPENDENT_AMBULATORY_CARE_PROVIDER_SITE_OTHER): Payer: Medicare Other | Admitting: Vascular Surgery

## 2018-12-29 ENCOUNTER — Other Ambulatory Visit: Payer: Self-pay

## 2018-12-29 VITALS — BP 165/74 | HR 53 | Resp 10 | Ht 66.0 in | Wt 155.0 lb

## 2018-12-29 DIAGNOSIS — I890 Lymphedema, not elsewhere classified: Secondary | ICD-10-CM | POA: Insufficient documentation

## 2018-12-29 DIAGNOSIS — I10 Essential (primary) hypertension: Secondary | ICD-10-CM | POA: Diagnosis not present

## 2018-12-29 DIAGNOSIS — R600 Localized edema: Secondary | ICD-10-CM

## 2018-12-29 DIAGNOSIS — I82811 Embolism and thrombosis of superficial veins of right lower extremities: Secondary | ICD-10-CM

## 2018-12-29 DIAGNOSIS — E785 Hyperlipidemia, unspecified: Secondary | ICD-10-CM

## 2018-12-29 DIAGNOSIS — M7989 Other specified soft tissue disorders: Secondary | ICD-10-CM

## 2018-12-29 NOTE — Assessment & Plan Note (Signed)
Repeat duplex

## 2018-12-29 NOTE — Assessment & Plan Note (Signed)
With some skin blistering and impending ulceration on the right.  A 3 layer Unna boot was placed today on the right leg. The patient seems to have developed lymphedema of the right lower extremity from chronic scarring and lymphatic channels.  This would be stage II lymphedema with dermal fibrosis and skin thickening as well as marked swelling.  We are going to wrap him in Unna boots today to try to get his skin care improved and avoid large ulcerations.  This will get his swelling under control as well.  Although he seems to be opposed to compression socks, he is going to need some sort of compression garment to keep the swelling under control going forward.  Elevation is also of benefit.  Increasing his activity would also be helpful.  At this point, weekly Unna boots will be planned.  I am going to repeat a venous reflux study in a couple of weeks to try to determine if recurrent thrombophlebitis may be present or worsening reflux.  I also think he would benefit from a lymphedema pump going forward as an adjuvant therapy in addition to compression and elevation.  We will begin that process as well.

## 2018-12-29 NOTE — Progress Notes (Signed)
MRN : [id number]  Kelly Hoover is a 85 y.o. (1931/02/28) male who presents with chief complaint of  Chief Complaint  Patient presents with  . Follow-up  .  History of Present Illness: Patient returns today in follow up of his leg swelling.  He is here earlier than his scheduled visit due to worsening leg swelling.  He attributes this leg swelling to compression stockings so he has stopped using them which has worsened the leg swelling.  The right leg is the predominantly affected leg although the left leg is having swelling as well.  The skin has become very thickened and red.  He attributes the redness to the compression stockings as well even though he has not been wearing them.  No fevers or chills or signs of infection.  There is some blistering on the leg but he says it really has not been weeping.  Current Outpatient Medications  Medication Sig Dispense Refill  . amiodarone (PACERONE) 200 MG tablet Take 200 mg by mouth daily.    . cholecalciferol (VITAMIN D) 1000 UNITS tablet Take 1,000 Units by mouth daily.    . cyanocobalamin 1000 MCG tablet Take 100 mcg by mouth daily.    . finasteride (PROSCAR) 5 MG tablet Take by mouth.    . tamsulosin (FLOMAX) 0.4 MG CAPS capsule TAKE 1 CAPSULE DAILY 90 capsule 0  . Triamcinolone Acetonide (TRIAMCINOLONE 0.1 % CREAM : EUCERIN) CREA Apply 1 application topically 2 (two) times daily as needed.    Marland Kitchen aspirin 81 MG tablet Take 81 mg by mouth daily.    . Carboxymethylcellulose Sodium (THERATEARS OP) Apply to eye as needed.    . sodium chloride (OCEAN) 0.65 % nasal spray Place 1 spray into the nose as needed for congestion.    . triamcinolone (NASACORT ALLERGY 24HR) 55 MCG/ACT AERO nasal inhaler Place 2 sprays into the nose daily. (Patient not taking: Reported on 09/23/2018) 1 Inhaler 12   No current facility-administered medications for this visit.     Past Medical History:  Diagnosis Date  . Actinic keratosis    Followed at [facility] q 6 month   . Allergy    Red meat  . Asthma    as a child  . Atrial fibrillation ([place]) 09/02/2018  . Basal cell carcinoma of ear 2007   Mohs surgery - followed at [facility] q 6 months  . BPH (benign prostatic hypertrophy)   . Degenerative disk disease    C3-C4, C5-C6  . Eczema     Past Surgical History:  Procedure Laterality Date  . FINGER SURGERY     left hand middle and index finger  . MOHS SURGERY     top of right ear  . rectal fistula  1980   repair  . VASECTOMY  1970    Social History Social History   Tobacco Use  . Smoking status: Never Smoker  . Smokeless tobacco: Never Used  Substance Use Topics  . Alcohol use: Yes    Alcohol/week: 14.0 standard drinks    Types: 14 drink(s) per week    Comment: 1-2 glasses of wine daily  . Drug use: No    Family History Family History  Problem Relation Age of Onset  . Stroke Father        Died age 37  . Cancer Sister 56       lymphoma - remission  . Dementia Mother        died age 71 (dementia and osteopororis)  .  Hypothyroidism Daughter   . Hypothyroidism Son   . Hypothyroidism Daughter   . Hypothyroidism Daughter     Allergies  Allergen Reactions  . Other Other (See Comments)    Year round allergies: (Trees, grasses, molds, airborne bacteria.  Stuffy nasal     REVIEW OF SYSTEMS (Negative unless checked)  Constitutional: [] Weight loss  [] Fever  [] Chills Cardiac: [] Chest pain   [] Chest pressure   [x] Palpitations   [] Shortness of breath when laying flat   [] Shortness of breath at rest   [x] Shortness of breath with exertion. Vascular:  [] Pain in legs with walking   [] Pain in legs at rest   [] Pain in legs when laying flat   [] Claudication   [] Pain in feet when walking  [] Pain in feet at rest  [] Pain in feet when laying flat   [] History of DVT   [x] Phlebitis   [x] Swelling in legs   [x] Varicose veins   [] Non-healing ulcers Pulmonary:   [] Uses home oxygen   [] Productive cough   [] Hemoptysis   [] Wheeze  [] COPD   [] Asthma  Neurologic:  [] Dizziness  [] Blackouts   [] Seizures   [] History of stroke   [] History of TIA  [] Aphasia   [] Temporary blindness   [] Dysphagia   [] Weakness or numbness in arms   [] Weakness or numbness in legs Musculoskeletal:  [x] Arthritis   [] Joint swelling   [x] Joint pain   [] Low back pain Hematologic:  [] Easy bruising  [] Easy bleeding   [] Hypercoagulable state   [] Anemic   Gastrointestinal:  [] Blood in stool   [] Vomiting blood  [] Gastroesophageal reflux/heartburn   [] Abdominal pain Genitourinary:  [] Chronic kidney disease   [] Difficult urination  [] Frequent urination  [] Burning with urination   [] Hematuria Skin:  [] Rashes   [] Ulcers   [] Wounds Psychological:  [] History of anxiety   []  History of major depression.  Physical Examination  BP (!) 165/74 (BP Location: Left Arm, Patient Position: Sitting, Cuff Size: Small)   Pulse (!) 53   Resp 10   Ht 5\' 6"  (1.676 m)   Wt 155 lb (70.3 kg)   BMI 25.02 kg/m  Gen:  WD/WN, NAD.  Appears younger than stated age Head: [place]/AT, No temporalis wasting. Ear/Nose/Throat: Hearing grossly intact, nares w/o erythema or drainage Eyes: Conjunctiva clear. Sclera non-icteric Neck: Supple.  Trachea midline Pulmonary:  Good air movement, no use of accessory muscles.  Cardiac: irregular Vascular:  Vessel Right Left  Radial Palpable Palpable                          PT Not Palpable Trace Palpable  DP Trace Palpable 1+ Palpable    Musculoskeletal: M/S 5/5 throughout.  No deformity or atrophy. 3+ RLE edema, 1-2+ LLE edema. Neurologic: Sensation grossly intact in extremities.  Symmetrical.  Speech is fluent.  Psychiatric: Judgment intact, Mood & affect appropriate for pt's clinical situation. Dermatologic: No rashes or ulcers noted.  No cellulitis or open wounds.       Labs No results found for this or any previous visit (from the past 2160 hour(s)).  Radiology No results found.  Assessment/Plan  Benign essential HTN blood pressure control  important in reducing the progression of atherosclerotic disease. On appropriate oral medications.   Lymphedema The patient seems to have developed lymphedema of the right lower extremity from chronic scarring and lymphatic channels.  This would be stage II lymphedema with dermal fibrosis and skin thickening as well as marked swelling.  We are going to wrap him in [place]  boots today to try to get his skin care improved and avoid large ulcerations.  This will get his swelling under control as well.  Although he seems to be opposed to compression socks, he is going to need some sort of compression garment to keep the swelling under control going forward.  Elevation is also of benefit.  Increasing his activity would also be helpful.  At this point, weekly Unna boots will be planned.  I am going to repeat a venous reflux study in a couple of weeks to try to determine if recurrent thrombophlebitis may be present or worsening reflux.  I also think he would benefit from a lymphedema pump going forward as an adjuvant therapy in addition to compression and elevation.  We will begin that process as well.  Chronic superficial venous thrombosis of lower extremity, right Repeat duplex  Swelling of limb With some skin blistering and impending ulceration on the right.  A 3 layer Unna boot was placed today on the right leg. The patient seems to have developed lymphedema of the right lower extremity from chronic scarring and lymphatic channels.  This would be stage II lymphedema with dermal fibrosis and skin thickening as well as marked swelling.  We are going to wrap him in Unna boots today to try to get his skin care improved and avoid large ulcerations.  This will get his swelling under control as well.  Although he seems to be opposed to compression socks, he is going to need some sort of compression garment to keep the swelling under control going forward.  Elevation is also of benefit.  Increasing his activity would  also be helpful.  At this point, weekly Unna boots will be planned.  I am going to repeat a venous reflux study in a couple of weeks to try to determine if recurrent thrombophlebitis may be present or worsening reflux.  I also think he would benefit from a lymphedema pump going forward as an adjuvant therapy in addition to compression and elevation.  We will begin that process as well.    Leotis Pain, MD  12/29/2018 10:54 AM    This note was created with Dragon medical transcription system.  Any errors from dictation are purely unintentional

## 2018-12-29 NOTE — Assessment & Plan Note (Signed)
blood pressure control important in reducing the progression of atherosclerotic disease. On appropriate oral medications.  

## 2018-12-29 NOTE — Patient Instructions (Signed)

## 2018-12-29 NOTE — Assessment & Plan Note (Signed)
The patient seems to have developed lymphedema of the right lower extremity from chronic scarring and lymphatic channels.  This would be stage II lymphedema with dermal fibrosis and skin thickening as well as marked swelling.  We are going to wrap him in Unna boots today to try to get his skin care improved and avoid large ulcerations.  This will get his swelling under control as well.  Although he seems to be opposed to compression socks, he is going to need some sort of compression garment to keep the swelling under control going forward.  Elevation is also of benefit.  Increasing his activity would also be helpful.  At this point, weekly Unna boots will be planned.  I am going to repeat a venous reflux study in a couple of weeks to try to determine if recurrent thrombophlebitis may be present or worsening reflux.  I also think he would benefit from a lymphedema pump going forward as an adjuvant therapy in addition to compression and elevation.  We will begin that process as well.

## 2019-01-05 ENCOUNTER — Other Ambulatory Visit: Payer: Self-pay

## 2019-01-05 ENCOUNTER — Encounter (INDEPENDENT_AMBULATORY_CARE_PROVIDER_SITE_OTHER): Payer: Self-pay

## 2019-01-05 ENCOUNTER — Ambulatory Visit (INDEPENDENT_AMBULATORY_CARE_PROVIDER_SITE_OTHER): Payer: Medicare Other | Admitting: Nurse Practitioner

## 2019-01-05 VITALS — BP 149/78 | HR 52 | Resp 16 | Wt 156.4 lb

## 2019-01-05 DIAGNOSIS — L97919 Non-pressure chronic ulcer of unspecified part of right lower leg with unspecified severity: Secondary | ICD-10-CM | POA: Diagnosis not present

## 2019-01-05 DIAGNOSIS — I890 Lymphedema, not elsewhere classified: Secondary | ICD-10-CM

## 2019-01-05 NOTE — Progress Notes (Signed)
History of Present Illness  There is no documented history at this time  Assessments & Plan   There are no diagnoses linked to this encounter.    Additional instructions  Subjective:  Patient presents with venous ulcer of the Right lower extremity.    Procedure:  3 layer unna wrap was placed Right lower extremity.   Plan:   Follow up in one week.   

## 2019-01-12 ENCOUNTER — Encounter (INDEPENDENT_AMBULATORY_CARE_PROVIDER_SITE_OTHER): Payer: Self-pay | Admitting: Nurse Practitioner

## 2019-01-12 ENCOUNTER — Ambulatory Visit (INDEPENDENT_AMBULATORY_CARE_PROVIDER_SITE_OTHER): Payer: Medicare Other | Admitting: Nurse Practitioner

## 2019-01-12 ENCOUNTER — Other Ambulatory Visit: Payer: Self-pay

## 2019-01-12 VITALS — BP 138/85 | HR 58 | Resp 14 | Ht 66.0 in | Wt 155.0 lb

## 2019-01-12 DIAGNOSIS — L97919 Non-pressure chronic ulcer of unspecified part of right lower leg with unspecified severity: Secondary | ICD-10-CM

## 2019-01-12 DIAGNOSIS — I890 Lymphedema, not elsewhere classified: Secondary | ICD-10-CM | POA: Diagnosis not present

## 2019-01-12 NOTE — Progress Notes (Signed)
History of Present Illness  There is no documented history at this time  Assessments & Plan   There are no diagnoses linked to this encounter.    Additional instructions  Subjective:  Patient presents with venous ulcer of the Right lower extremity.    Procedure:  3 layer unna wrap was placed Right lower extremity.   Plan:   Follow up in one week.   

## 2019-01-15 ENCOUNTER — Encounter (INDEPENDENT_AMBULATORY_CARE_PROVIDER_SITE_OTHER): Payer: Self-pay

## 2019-01-19 ENCOUNTER — Ambulatory Visit (INDEPENDENT_AMBULATORY_CARE_PROVIDER_SITE_OTHER): Payer: Medicare Other | Admitting: Nurse Practitioner

## 2019-01-19 ENCOUNTER — Encounter (INDEPENDENT_AMBULATORY_CARE_PROVIDER_SITE_OTHER): Payer: Self-pay | Admitting: Nurse Practitioner

## 2019-01-19 ENCOUNTER — Other Ambulatory Visit: Payer: Self-pay

## 2019-01-19 ENCOUNTER — Ambulatory Visit (INDEPENDENT_AMBULATORY_CARE_PROVIDER_SITE_OTHER): Payer: Medicare Other

## 2019-01-19 VITALS — BP 166/70 | HR 56 | Resp 16 | Wt 146.8 lb

## 2019-01-19 DIAGNOSIS — Z79899 Other long term (current) drug therapy: Secondary | ICD-10-CM

## 2019-01-19 DIAGNOSIS — I872 Venous insufficiency (chronic) (peripheral): Secondary | ICD-10-CM

## 2019-01-19 DIAGNOSIS — I890 Lymphedema, not elsewhere classified: Secondary | ICD-10-CM

## 2019-01-19 DIAGNOSIS — R600 Localized edema: Secondary | ICD-10-CM | POA: Diagnosis not present

## 2019-01-19 DIAGNOSIS — I10 Essential (primary) hypertension: Secondary | ICD-10-CM

## 2019-01-19 DIAGNOSIS — M7989 Other specified soft tissue disorders: Secondary | ICD-10-CM

## 2019-01-19 NOTE — Telephone Encounter (Signed)
All questions were answered at today's visit with patient and daughter.

## 2019-01-22 ENCOUNTER — Encounter (INDEPENDENT_AMBULATORY_CARE_PROVIDER_SITE_OTHER): Payer: Self-pay | Admitting: Nurse Practitioner

## 2019-01-22 NOTE — Progress Notes (Signed)
SUBJECTIVE:  Patient ID: Kelly Hoover, male    DOB: 1931-03-23, 85 y.o.   MRN: [id number] Chief Complaint  Patient presents with  . Follow-up    ultrasound and unna follow up    HPI  Kelly Hoover is a 85 y.o. male that the presents today for Unna wrap follow-up as well as noninvasive studies.  Today the ulcerations on his right lower extremity are resolved.  He still has some edema of his bilateral lower extremities.  He has received his lymphedema pump and thus far it has been beneficial in his care.  The patient still continues to struggle with wearing compression socks due to difficulty with placing them as well as pain at the ankle.  Patient's visit attended by daughter over the phone.  Today noninvasive studies show reflux within the common femoral vein, femoral vein and great saphenous vein.  No DVT or superficial venous thrombosis detected.  Past Medical History:  Diagnosis Date  . Actinic keratosis    Followed at [facility] q 6 month  . Allergy    Red meat  . Asthma    as a child  . Atrial fibrillation ([place]) 09/02/2018  . Basal cell carcinoma of ear 2007   Mohs surgery - followed at [facility] q 6 months  . BPH (benign prostatic hypertrophy)   . Degenerative disk disease    C3-C4, C5-C6  . Eczema     Past Surgical History:  Procedure Laterality Date  . FINGER SURGERY     left hand middle and index finger  . MOHS SURGERY     top of right ear  . rectal fistula  1980   repair  . VASECTOMY  1970    Social History   Socioeconomic History  . Marital status: Widowed    Spouse name: Not on file  . Number of children: 4  . Years of education: 71  . Highest education level: Not on file  Occupational History  . Not on file  Social Needs  . Financial resource strain: Not on file  . Food insecurity    Worry: Not on file    Inability: Not on file  . Transportation needs    Medical: Not on file    Non-medical: Not on file  Tobacco Use  . Smoking status: Never  Smoker  . Smokeless tobacco: Never Used  Substance and Sexual Activity  . Alcohol use: Yes    Alcohol/week: 14.0 standard drinks    Types: 14 drink(s) per week    Comment: 1-2 glasses of wine daily  . Drug use: No  . Sexual activity: Not on file  Lifestyle  . Physical activity    Days per week: Not on file    Minutes per session: Not on file  . Stress: Not on file  Relationships  . Social Herbalist on phone: Not on file    Gets together: Not on file    Attends religious service: Not on file    Active member of club or organization: Not on file    Attends meetings of clubs or organizations: Not on file    Relationship status: Not on file  . Intimate partner violence    Fear of current or ex partner: Not on file    Emotionally abused: Not on file    Physically abused: Not on file    Forced sexual activity: Not on file  Other Topics Concern  . Not on file  Social History  Narrative  . Not on file    Family History  Problem Relation Age of Onset  . Stroke Father        Died age 65  . Cancer Sister 29       lymphoma - remission  . Dementia Mother        died age 105 (dementia and osteopororis)  . Hypothyroidism Daughter   . Hypothyroidism Son   . Hypothyroidism Daughter   . Hypothyroidism Daughter     Allergies  Allergen Reactions  . Other Other (See Comments)    Year round allergies: (Trees, grasses, molds, airborne bacteria.  Stuffy nasal     Review of Systems   Review of Systems: Negative Unless Checked Constitutional: [] Weight loss  [] Fever  [] Chills Cardiac: [] Chest pain   []  Atrial Fibrillation  [] Palpitations   [] Shortness of breath when laying flat   [] Shortness of breath with exertion. [] Shortness of breath at rest Vascular:  [] Pain in legs with walking   [] Pain in legs with standing [] Pain in legs when laying flat   [] Claudication    [] Pain in feet when laying flat    [] History of DVT   [] Phlebitis   [x] Swelling in legs   [x] Varicose veins    [] Non-healing ulcers Pulmonary:   [] Uses home oxygen   [] Productive cough   [] Hemoptysis   [] Wheeze  [] COPD   [] Asthma Neurologic:  [] Dizziness   [] Seizures  [] Blackouts [] History of stroke   [] History of TIA  [] Aphasia   [] Temporary Blindness   [] Weakness or numbness in arm   [] Weakness or numbness in leg Musculoskeletal:   [] Joint swelling   [] Joint pain   [] Low back pain  []  History of Knee Replacement [x] Arthritis [] back Surgeries  []  Spinal Stenosis    Hematologic:  [] Easy bruising  [] Easy bleeding   [] Hypercoagulable state   [] Anemic Gastrointestinal:  [] Diarrhea   [] Vomiting  [] Gastroesophageal reflux/heartburn   [] Difficulty swallowing. [] Abdominal pain Genitourinary:  [] Chronic kidney disease   [] Difficult urination  [] Anuric   [] Blood in urine [] Frequent urination  [] Burning with urination   [] Hematuria Skin:  [] Rashes   [] Ulcers [] Wounds Psychological:  [] History of anxiety   []  History of major depression  []  Memory Difficulties      OBJECTIVE:   Physical Exam  BP (!) 166/70 (BP Location: Left Arm)   Pulse (!) 56   Resp 16   Wt 146 lb 12.8 oz (66.6 kg)   BMI 23.69 kg/m   Gen: WD/WN, NAD Head: [place]/AT, No temporalis wasting.  Ear/Nose/Throat: Hearing grossly intact, nares w/o erythema or drainage Eyes: PER, EOMI, sclera nonicteric.  Neck: Supple, no masses.  No JVD.  Pulmonary:  Good air movement, no use of accessory muscles.  Cardiac: RRR Vascular:  2+ pitting edema bilaterally.  Ulcerations of right lower extremity resolved Vessel Right Left  Radial Palpable Palpable  Dorsalis Pedis Palpable Palpable  Posterior Tibial Palpable Palpable   Gastrointestinal: soft, non-distended. No guarding/no peritoneal signs.  Musculoskeletal: M/S 5/5 throughout.  No deformity or atrophy.  Neurologic: Pain and light touch intact in extremities.  Symmetrical.  Speech is fluent. Motor exam as listed above. Psychiatric: Judgment intact, Mood & affect appropriate for pt's clinical  situation. Dermatologic: No Venous rashes. No Ulcers Noted.  No changes consistent with cellulitis. Lymph : No Cervical lymphadenopathy, no lichenification or skin changes of chronic lymphedema.       ASSESSMENT AND PLAN:  1. Venous insufficiency of both lower extremities The patient has evidence of chronic venous insufficiency  of both his deep and superficial systems.  However the patient does not complain of issues with varicose veins, heaviness or achiness of his lower extremities.  His major issue with swelling.  At this time it is felt that conservative therapy would be in the best interest of the patient.  He is again stressed to wear medical grade 1 compression stockings as this is a mainstay of therapy.  He is urged to elevate his lower extremities as much as possible as well as exercise when possible.  2. Lymphedema  No surgery or intervention at this point in time.    I have reviewed my discussion with the patient regarding lymphedema and why it  causes symptoms.  Patient will continue wearing graduated compression stockings class 1 (20-30 mmHg) on a daily basis a prescription was given. The patient is reminded to put the stockings on first thing in the morning and removing them in the evening. The patient is instructed specifically not to sleep in the stockings.   In addition, behavioral modification throughout the day will be continued.  This will include frequent elevation (such as in a recliner), use of over the counter pain medications as needed and exercise such as walking.  I have reviewed systemic causes for chronic edema such as liver, kidney and cardiac etiologies and there does not appear to be any significant changes in these organ systems over the past year.  The patient is under the impression that these organ systems are all stable and unchanged.    The patient will continue aggressive use of the  lymph pump.  This will continue to improve the edema control and prevent  sequela such as ulcers and infections.   Patient will follow-up in 6 months.   3. Benign essential HTN Continue antihypertensive medications as already ordered, these medications have been reviewed and there are no changes at this time.    Current Outpatient Medications on File Prior to Visit  Medication Sig Dispense Refill  . amiodarone (PACERONE) 200 MG tablet Take 200 mg by mouth daily.    Marland Kitchen aspirin 81 MG tablet Take 81 mg by mouth daily.    . Carboxymethylcellulose Sodium (THERATEARS OP) Apply to eye as needed.    . cholecalciferol (VITAMIN D) 1000 UNITS tablet Take 1,000 Units by mouth daily.    . cyanocobalamin 1000 MCG tablet Take 100 mcg by mouth daily.    . finasteride (PROSCAR) 5 MG tablet Take by mouth.    . sodium chloride (OCEAN) 0.65 % nasal spray Place 1 spray into the nose as needed for congestion.    . tamsulosin (FLOMAX) 0.4 MG CAPS capsule TAKE 1 CAPSULE DAILY 90 capsule 0  . Triamcinolone Acetonide (TRIAMCINOLONE 0.1 % CREAM : EUCERIN) CREA Apply 1 application topically 2 (two) times daily as needed.    . triamcinolone (NASACORT ALLERGY 24HR) 55 MCG/ACT AERO nasal inhaler Place 2 sprays into the nose daily. (Patient not taking: Reported on 09/23/2018) 1 Inhaler 12   No current facility-administered medications on file prior to visit.     There are no Patient Instructions on file for this visit. Return in about 6 months (around 07/21/2019) for Lymphedema.   Kris Hartmann, NP  This note was completed with Sales executive.  Any errors are purely unintentional.

## 2019-01-26 ENCOUNTER — Ambulatory Visit (INDEPENDENT_AMBULATORY_CARE_PROVIDER_SITE_OTHER): Payer: Medicare Other | Admitting: Vascular Surgery

## 2019-03-18 ENCOUNTER — Encounter (INDEPENDENT_AMBULATORY_CARE_PROVIDER_SITE_OTHER): Payer: Self-pay

## 2019-05-03 ENCOUNTER — Other Ambulatory Visit: Payer: Self-pay | Admitting: Neurology

## 2019-05-03 ENCOUNTER — Other Ambulatory Visit (HOSPITAL_COMMUNITY): Payer: Self-pay | Admitting: Neurology

## 2019-05-03 DIAGNOSIS — R413 Other amnesia: Secondary | ICD-10-CM

## 2019-06-10 ENCOUNTER — Ambulatory Visit (HOSPITAL_COMMUNITY)
Admission: RE | Admit: 2019-06-10 | Discharge: 2019-06-10 | Disposition: A | Discharge status: Home / Self Care | Payer: Medicare Other | Source: Ambulatory Visit | Attending: Neurology | Admitting: Neurology

## 2019-06-10 ENCOUNTER — Other Ambulatory Visit: Payer: Self-pay

## 2019-06-10 DIAGNOSIS — R413 Other amnesia: Secondary | ICD-10-CM | POA: Insufficient documentation

## 2019-06-21 IMAGING — US US EXTREM LOW VENOUS*R*
1 series · 13 of 24 positions shown · non-contrast
Comparison: None.

CLINICAL DATA: Right lower extremity swelling for 1 week



[Series 1: us extrem low venous*right* · 13 of 56 slices shown]
[im 1/56]
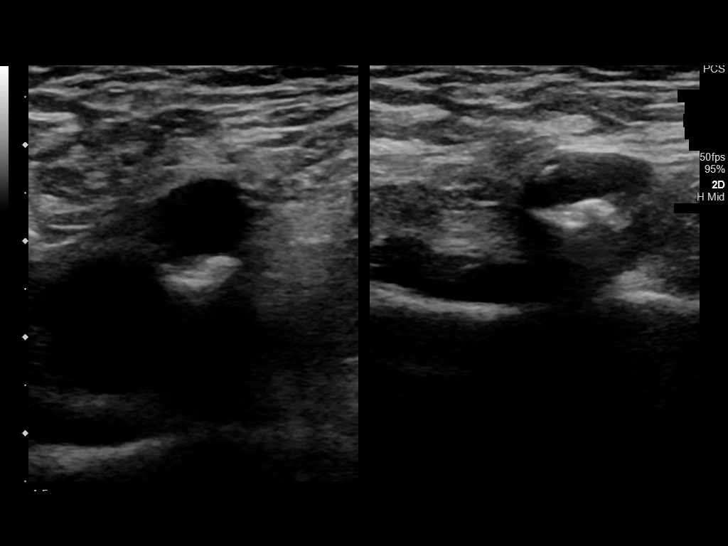
[im 5/56]
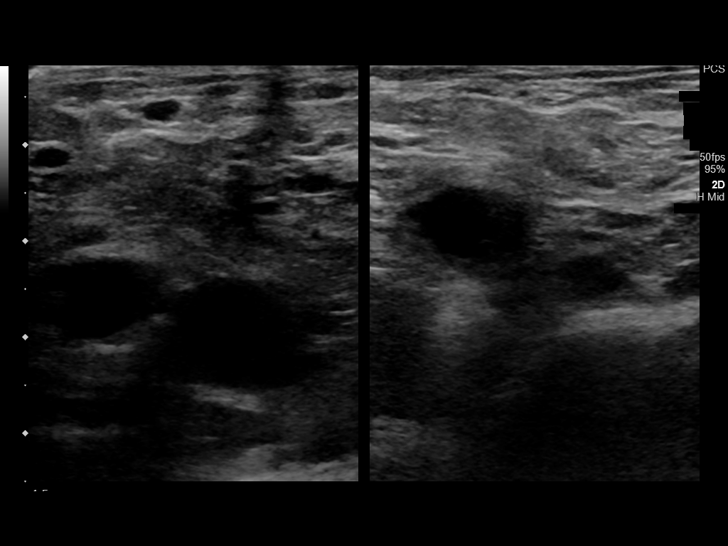
[im 10/56]
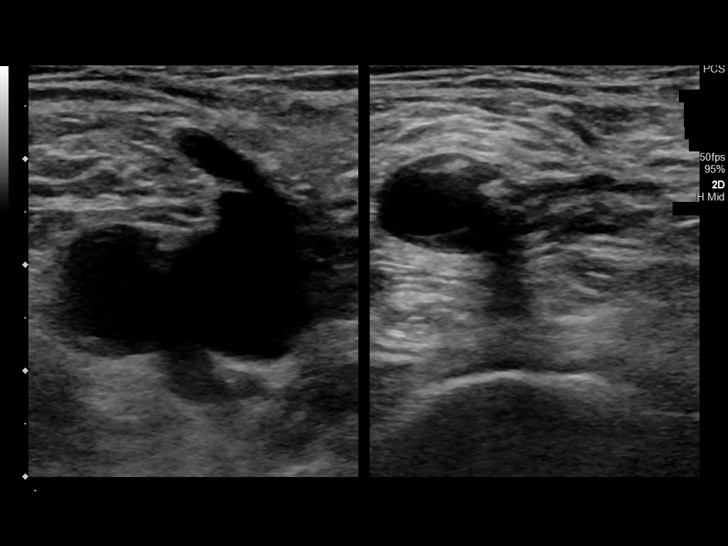
[im 15/56]
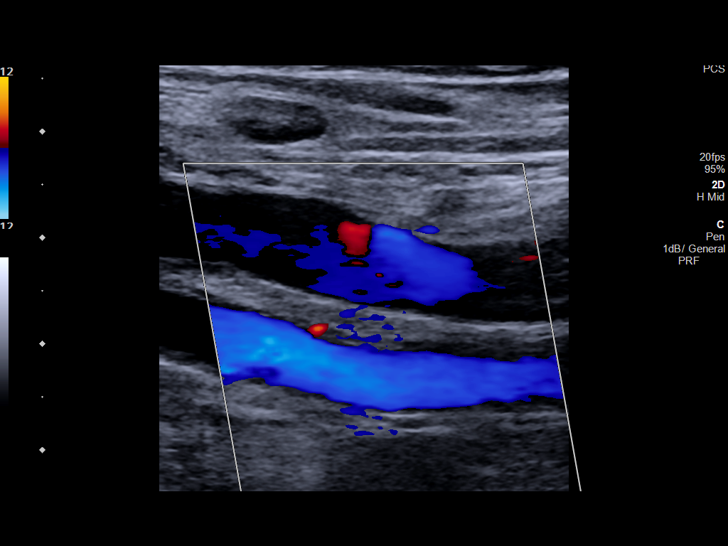
[im 20/56]
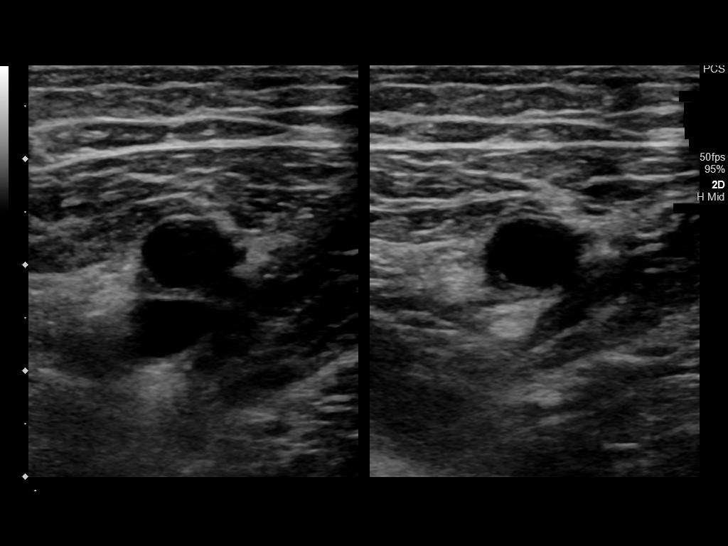
[im 24/56]
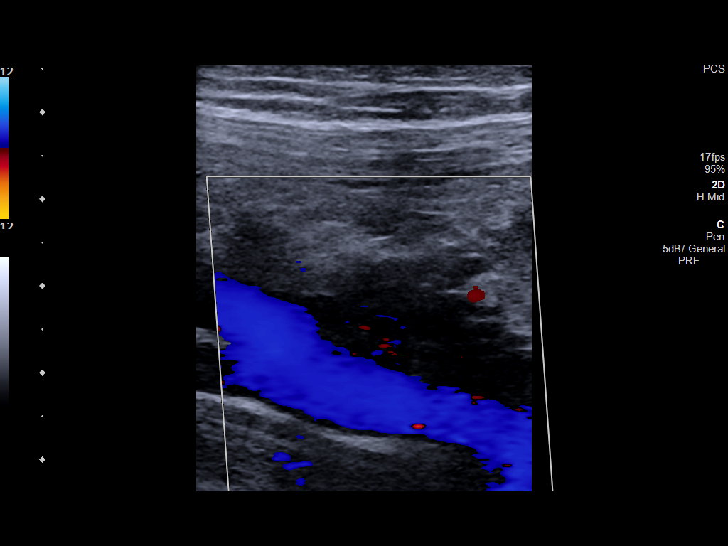
[im 29/56]
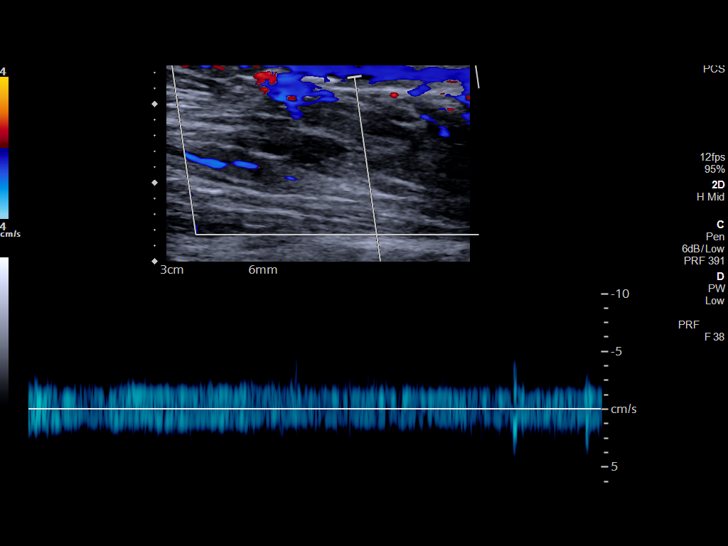
[im 32/56]
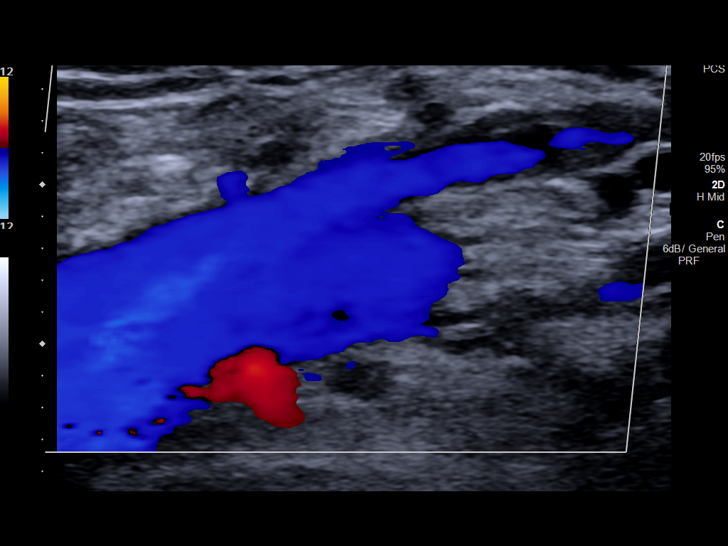
[im 36/56]
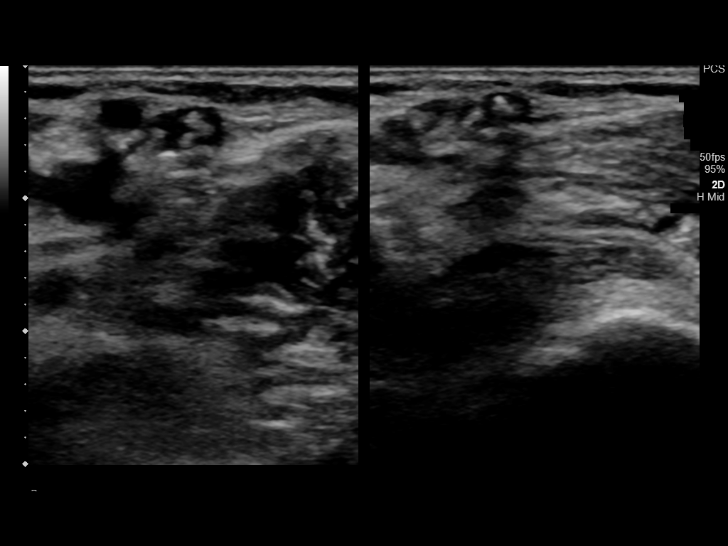
[im 41/56]
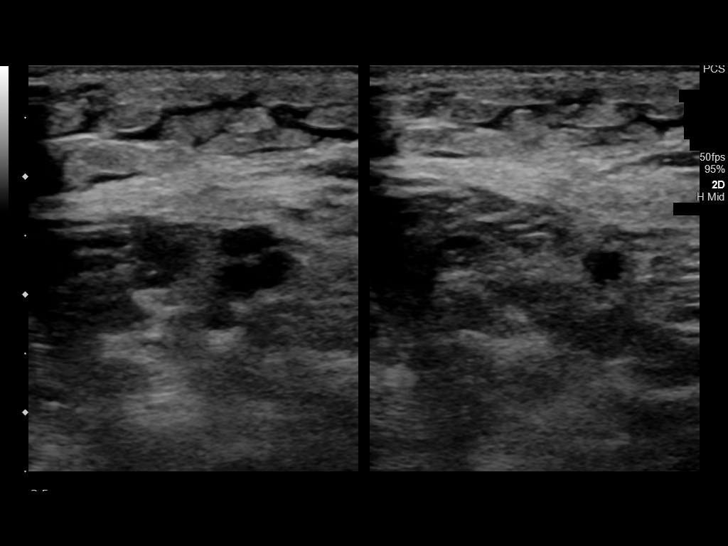
[im 46/56]
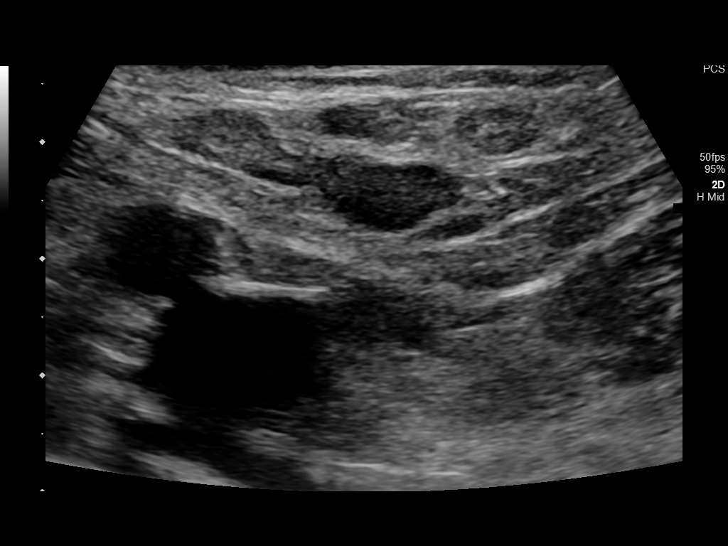
[im 51/56]
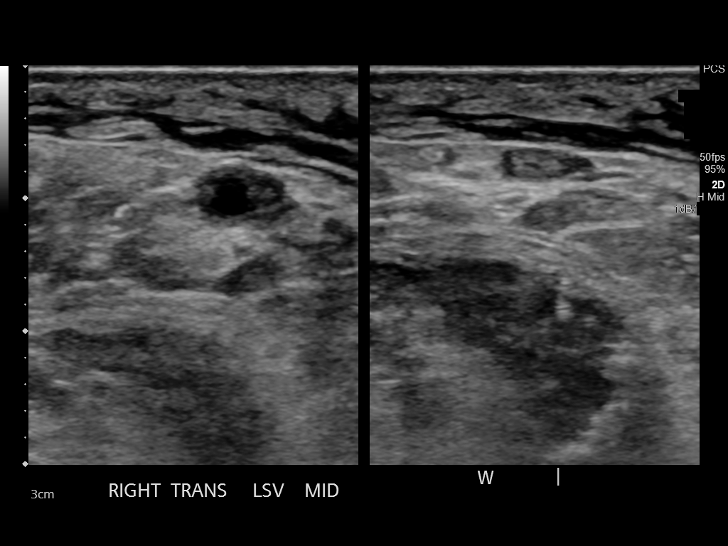
[im 56/56]
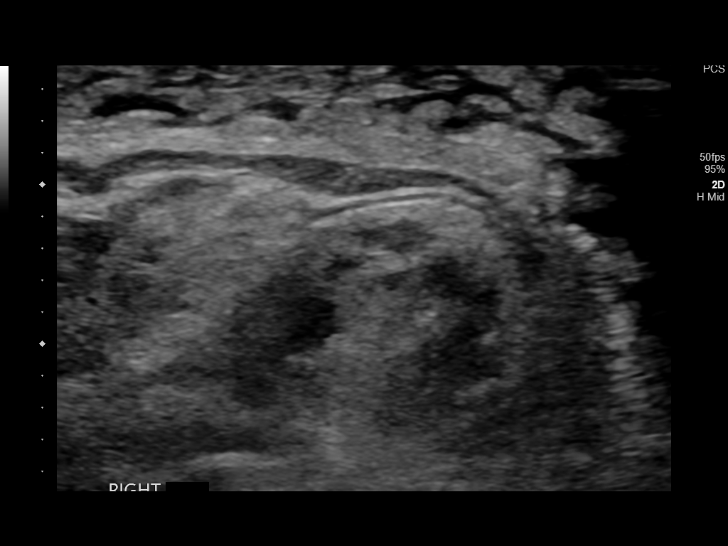

[13 of 24 positions shown; findings below may reference images not displayed]

FINDINGS: Contralateral Common Femoral Vein: Respiratory phasicity is normal
and symmetric with the symptomatic side. No evidence of thrombus.
Normal compressibility.

Common Femoral Vein: No evidence of thrombus. Normal
compressibility, respiratory phasicity and response to augmentation.

Saphenofemoral Junction: No evidence of thrombus. Normal
compressibility and flow on color Doppler imaging.

Profunda Femoral Vein: No evidence of thrombus. Normal
compressibility and flow on color Doppler imaging.

Femoral Vein: No evidence of thrombus. Normal compressibility,
respiratory phasicity and response to augmentation.

Popliteal Vein: No evidence of thrombus. Normal compressibility,
respiratory phasicity and response to augmentation.

Calf Veins: No evidence of thrombus. Normal compressibility and flow
on color Doppler imaging.

Superficial Great Saphenous Vein: No evidence of thrombus. Normal
compressibility.

Venous Reflux:  None.

Other Findings: Superficial thrombophlebitis is noted within the
lesser saphenous vein.
IMPRESSION: No evidence of deep venous thrombosis.

Changes consistent with superficial thrombophlebitis in the lesser
saphenous vein.

## 2019-07-23 ENCOUNTER — Ambulatory Visit (INDEPENDENT_AMBULATORY_CARE_PROVIDER_SITE_OTHER): Payer: Medicare Other | Admitting: Vascular Surgery

## 2019-08-03 ENCOUNTER — Ambulatory Visit (INDEPENDENT_AMBULATORY_CARE_PROVIDER_SITE_OTHER): Payer: Medicare Other | Admitting: Vascular Surgery

## 2020-06-03 IMAGING — MR MR HEAD W/O CM
9 of 12 series · 29 of 48 positions shown · non-contrast
Comparison: Head CT 07/27/2018

CLINICAL DATA: Memory loss.

EXAM:
MRI HEAD WITHOUT CONTRAST
TECHNIQUE: Multiplanar, multiecho pulse sequences of the brain and surrounding
structures were obtained without intravenous contrast.
Additionally, using [employer] software a 3D volumetric analysis of
the brain was performed and is compared to a normative database
adjusted for age, gender and intracranial volume.

[Series 3: DWI · axial · 3.0mm · 0.94mm/px · z∈[-62,+91]mm · 6 of 104 slices shown (1 of 2)]
[im 1/104]
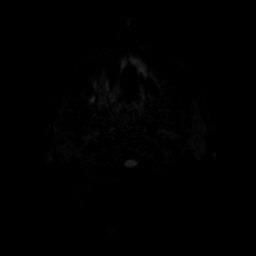
[im 21/104]
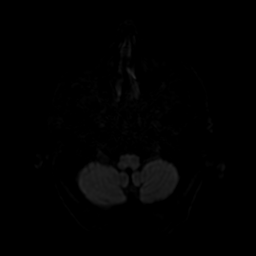
[im 42/104]
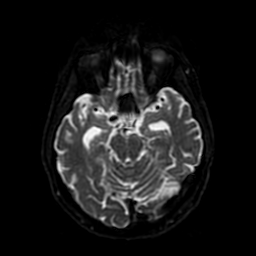
[im 62/104]
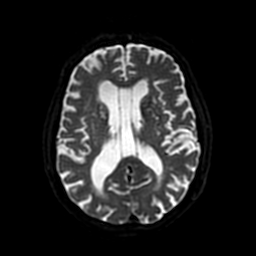
[im 83/104]
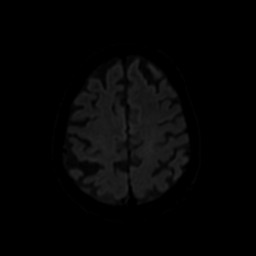
[im 104/104]
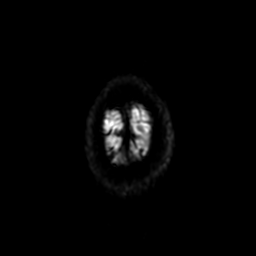

[Series 4: FLAIR · axial · 3.0mm · 0.47mm/px · z∈[-60,+89]mm · 2 of 26 slices shown (1 of 2)]
[im 1/26]
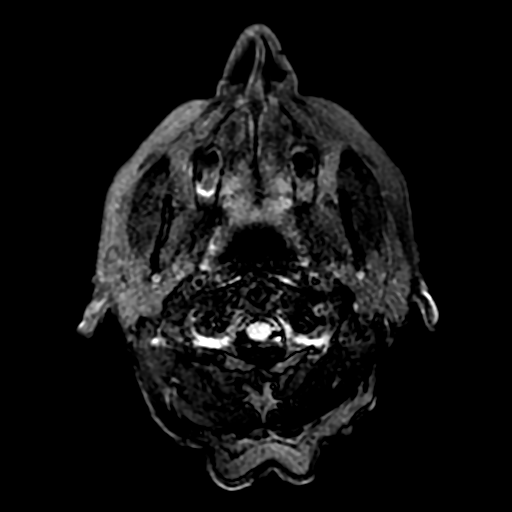
[im 26/26]
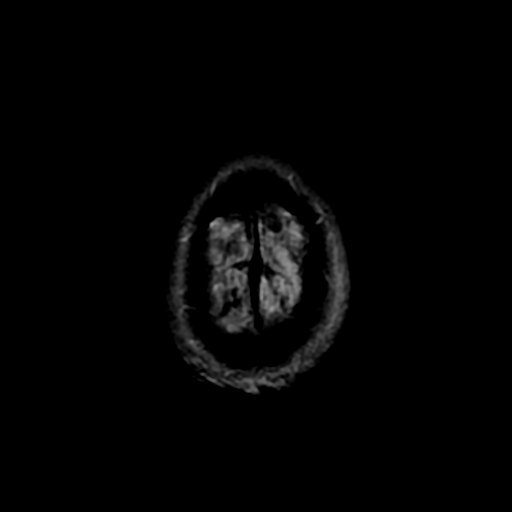

[Series 5: SWI · axial · 3.0mm · 0.47mm/px · z∈[-62,+92]mm · 6 of 104 slices shown]
[im 1/104]
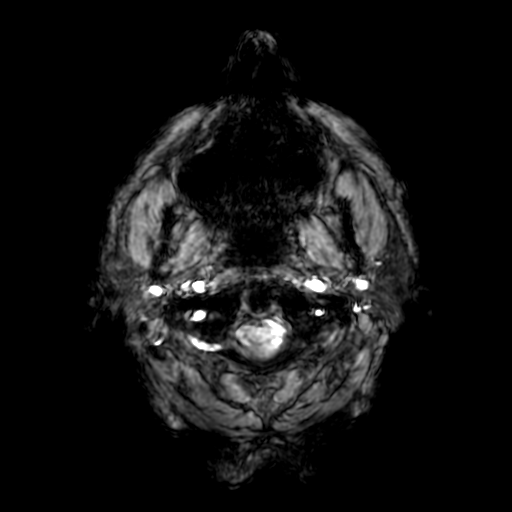
[im 21/104]
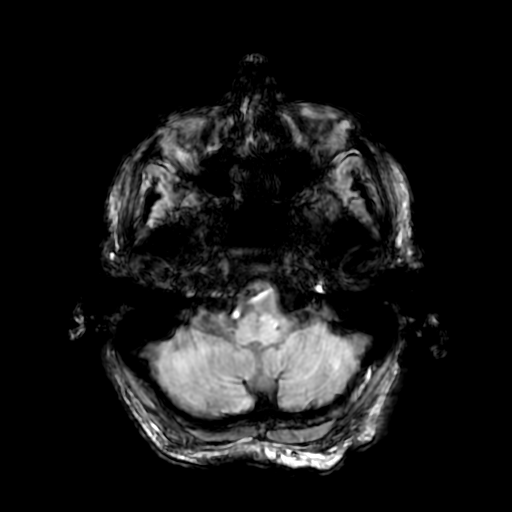
[im 42/104]
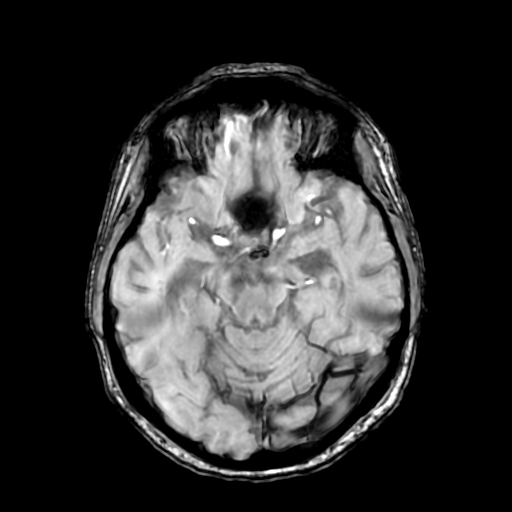
[im 62/104]
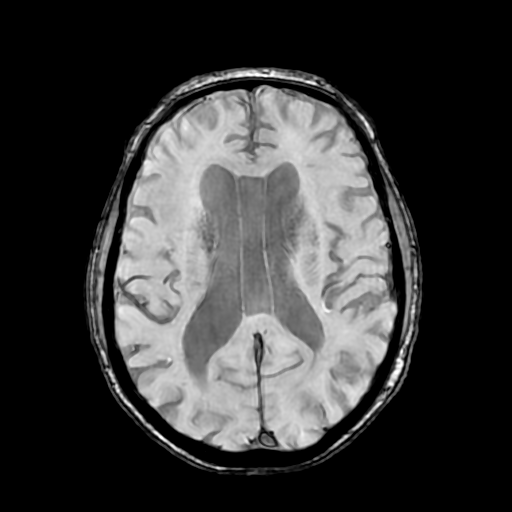
[im 83/104]
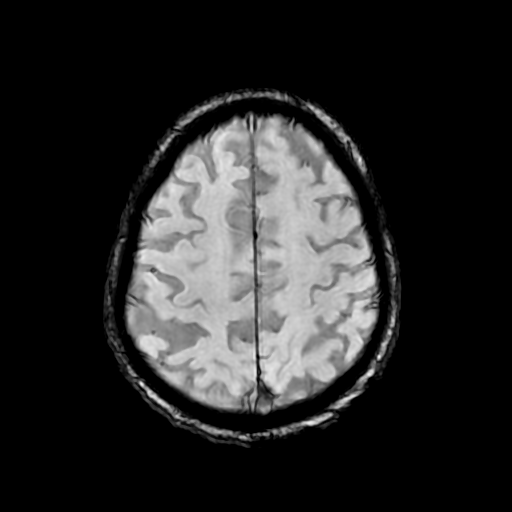
[im 104/104]
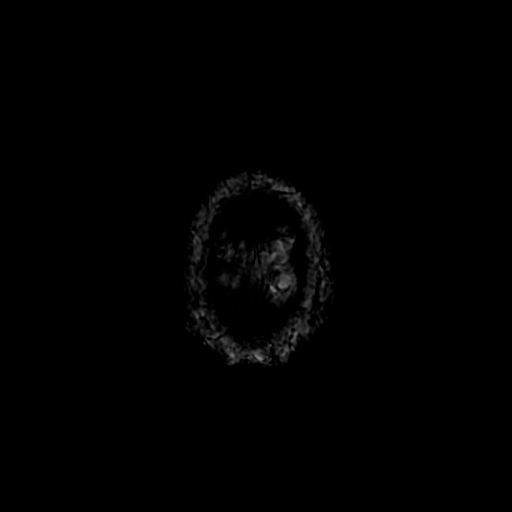

[Series 6: T2 · axial · 5.0mm · 0.47mm/px · z∈[-60,+89]mm · 2 of 26 slices shown (1 of 2)]
[im 1/26]
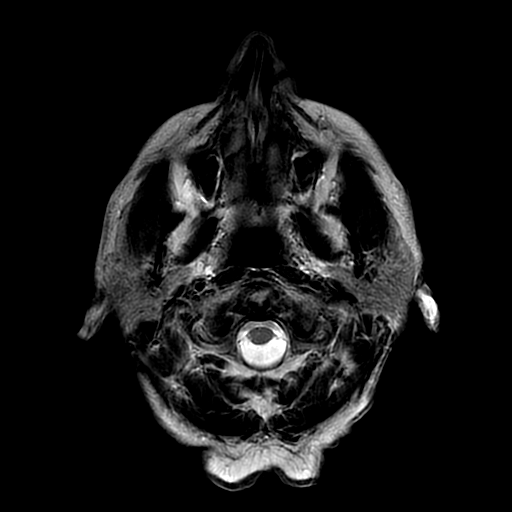
[im 26/26]
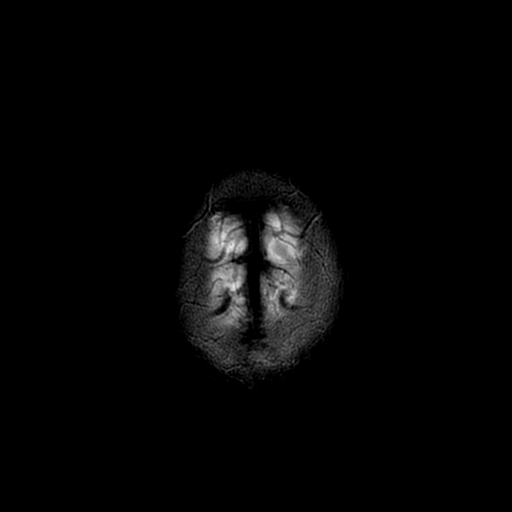

[Series 7: FLAIR · sagittal · 5.0mm · 0.47mm/px · 2 of 25 slices shown (2 of 2)]
[im 1/25]
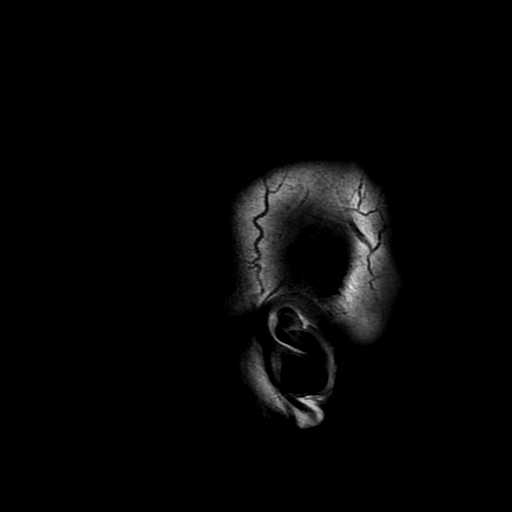
[im 25/25]
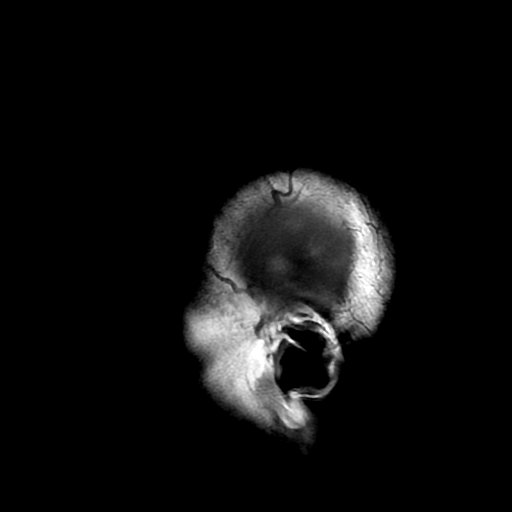

[Series 8: DWI · coronal · 4.0mm · 0.94mm/px · 4 of 72 slices shown (2 of 2)]
[im 1/72]
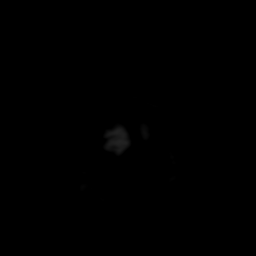
[im 24/72]
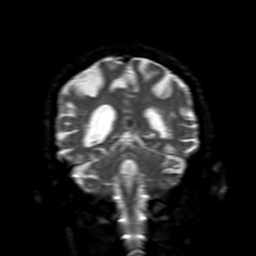
[im 48/72]
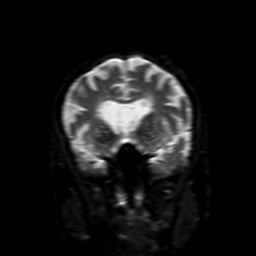
[im 72/72]
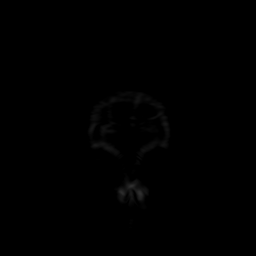

[Series 10: T2 · coronal · 5.0mm · 0.43mm/px · 2 of 30 slices shown (2 of 2)]
[im 1/30]
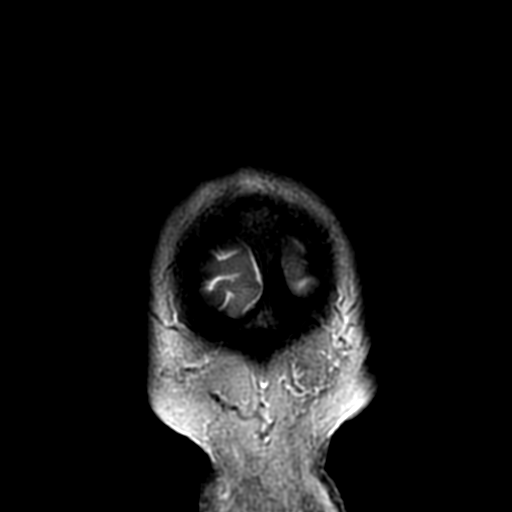
[im 30/30]
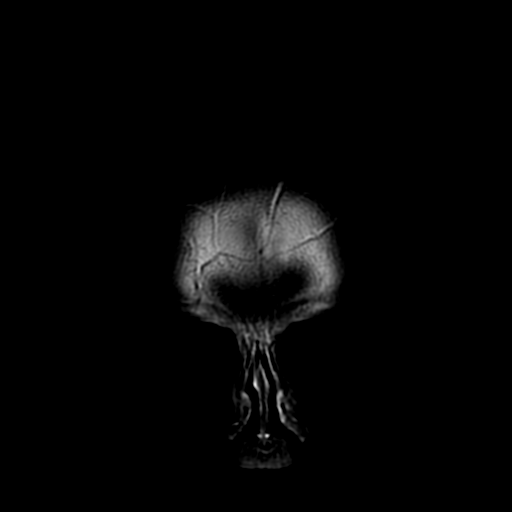

[Series 350: ADC · axial · 3.0mm · 0.94mm/px · z∈[-62,+91]mm · 3 of 52 slices shown (1 of 2)]
[im 1/52]
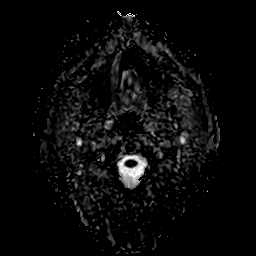
[im 26/52]
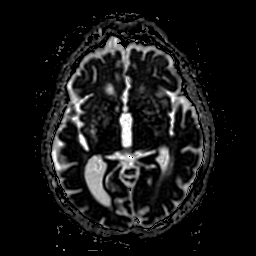
[im 52/52]
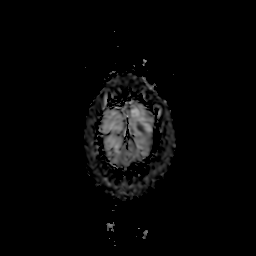

[Series 850: ADC · coronal · 4.0mm · 0.94mm/px · 2 of 36 slices shown (2 of 2)]
[im 1/36]
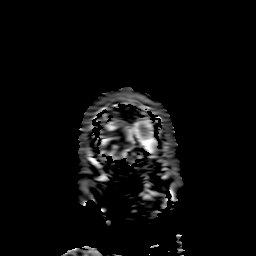
[im 36/36]
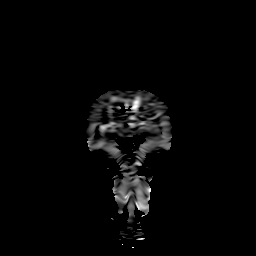

[29 of 48 positions shown; findings below may reference images not displayed]

FINDINGS: Brain: Diffusion imaging does not show any acute or subacute
infarction. There chronic small-vessel ischemic changes of the pons.
Cerebellum shows generalized atrophy without focal insult. Cerebral
hemispheres show generalized atrophy with moderate to severe chronic
small-vessel ischemic changes of the cerebral hemispheric white
matter. No cortical or large vessel territory infarction. Ventricles
are prominent, in proportion to the degree of atrophy. Incidental
cavum septum pellucidum. One could consider the possibility of
demyelinating disease as the etiology for some or all of the white
matter foci, but small-vessel disease is favored. No mass. No
hemorrhage. No extra-axial collection.

Vascular: Major vessels at the base of the brain show flow.

Skull and upper cervical spine: Negative

Sinuses/Orbits: Clear/normal

Other: None

NeuroQuant Findings:

Volumetric analysis of the brain was performed, with a fully
detailed report in [HOSPITAL] PACS. Briefly, the comparison with age and
gender matched reference reveals relative gray matter atrophy.
Atrophy shows temporal lobe predominance based on inferolateral
ventricular size in the greater than ninety-ninth percentile.
IMPRESSION: 1. No acute or reversible finding. Generalized brain atrophy with
some temporal lobe predominance. Cortical more than white matter
volume loss.
2. Chronic small-vessel ischemic changes throughout the brain as
outlined above. One could consider the possibility of demyelinating
disease being in part or old responsible for the white matter
lesions, but small-vessel disease is favored.
3. NeuroQuant volumetric analysis of the brain, see details on
[HOSPITAL] PACS.

## 2022-06-10 ENCOUNTER — Encounter (INDEPENDENT_AMBULATORY_CARE_PROVIDER_SITE_OTHER): Payer: Self-pay
